# Patient Record
Sex: Female | Born: 1975 | Race: White | Hispanic: No | Marital: Married | Smoking: Never smoker
Health system: Southern US, Community
[De-identification: ages and names within clinical notes are randomized; demographics above are authoritative.]

## PROBLEM LIST (undated history)

## (undated) DIAGNOSIS — IMO0002 Reserved for concepts with insufficient information to code with codable children: Secondary | ICD-10-CM

## (undated) DIAGNOSIS — K219 Gastro-esophageal reflux disease without esophagitis: Secondary | ICD-10-CM

## (undated) DIAGNOSIS — Z8619 Personal history of other infectious and parasitic diseases: Secondary | ICD-10-CM

## (undated) DIAGNOSIS — A499 Bacterial infection, unspecified: Secondary | ICD-10-CM

## (undated) DIAGNOSIS — B379 Candidiasis, unspecified: Secondary | ICD-10-CM

## (undated) DIAGNOSIS — Z8719 Personal history of other diseases of the digestive system: Secondary | ICD-10-CM

## (undated) DIAGNOSIS — R87619 Unspecified abnormal cytological findings in specimens from cervix uteri: Secondary | ICD-10-CM

## (undated) HISTORY — DX: Candidiasis, unspecified: B37.9

## (undated) HISTORY — DX: Personal history of other diseases of the digestive system: Z87.19

## (undated) HISTORY — DX: Reserved for concepts with insufficient information to code with codable children: IMO0002

## (undated) HISTORY — DX: Bacterial infection, unspecified: A49.9

## (undated) HISTORY — DX: Unspecified abnormal cytological findings in specimens from cervix uteri: R87.619

## (undated) HISTORY — DX: Personal history of other infectious and parasitic diseases: Z86.19

---
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
## 2002-06-03 ENCOUNTER — Other Ambulatory Visit: Admission: RE | Admit: 2002-06-03 | Discharge: 2002-06-03 | Payer: Self-pay | Admitting: Obstetrics & Gynecology

## 2003-06-21 ENCOUNTER — Other Ambulatory Visit: Admission: RE | Admit: 2003-06-21 | Discharge: 2003-06-21 | Payer: Self-pay | Admitting: Obstetrics & Gynecology

## 2006-08-18 ENCOUNTER — Ambulatory Visit (HOSPITAL_COMMUNITY): Admission: RE | Admit: 2006-08-18 | Discharge: 2006-08-18 | Payer: Self-pay | Admitting: Family Medicine

## 2006-08-18 ENCOUNTER — Emergency Department (HOSPITAL_COMMUNITY): Admission: EM | Admit: 2006-08-18 | Discharge: 2006-08-18 | Payer: Self-pay | Admitting: Family Medicine

## 2007-07-17 IMAGING — CR DG ANKLE COMPLETE 3+V*L*
3 series · 3 of 3 positions shown · non-contrast
Comparison: none

CLINICAL DATA: Left ankle pain and swelling status post fall.
 LEFT ANKLE - 3 VIEW:

[view not recorded (1 of 3)]
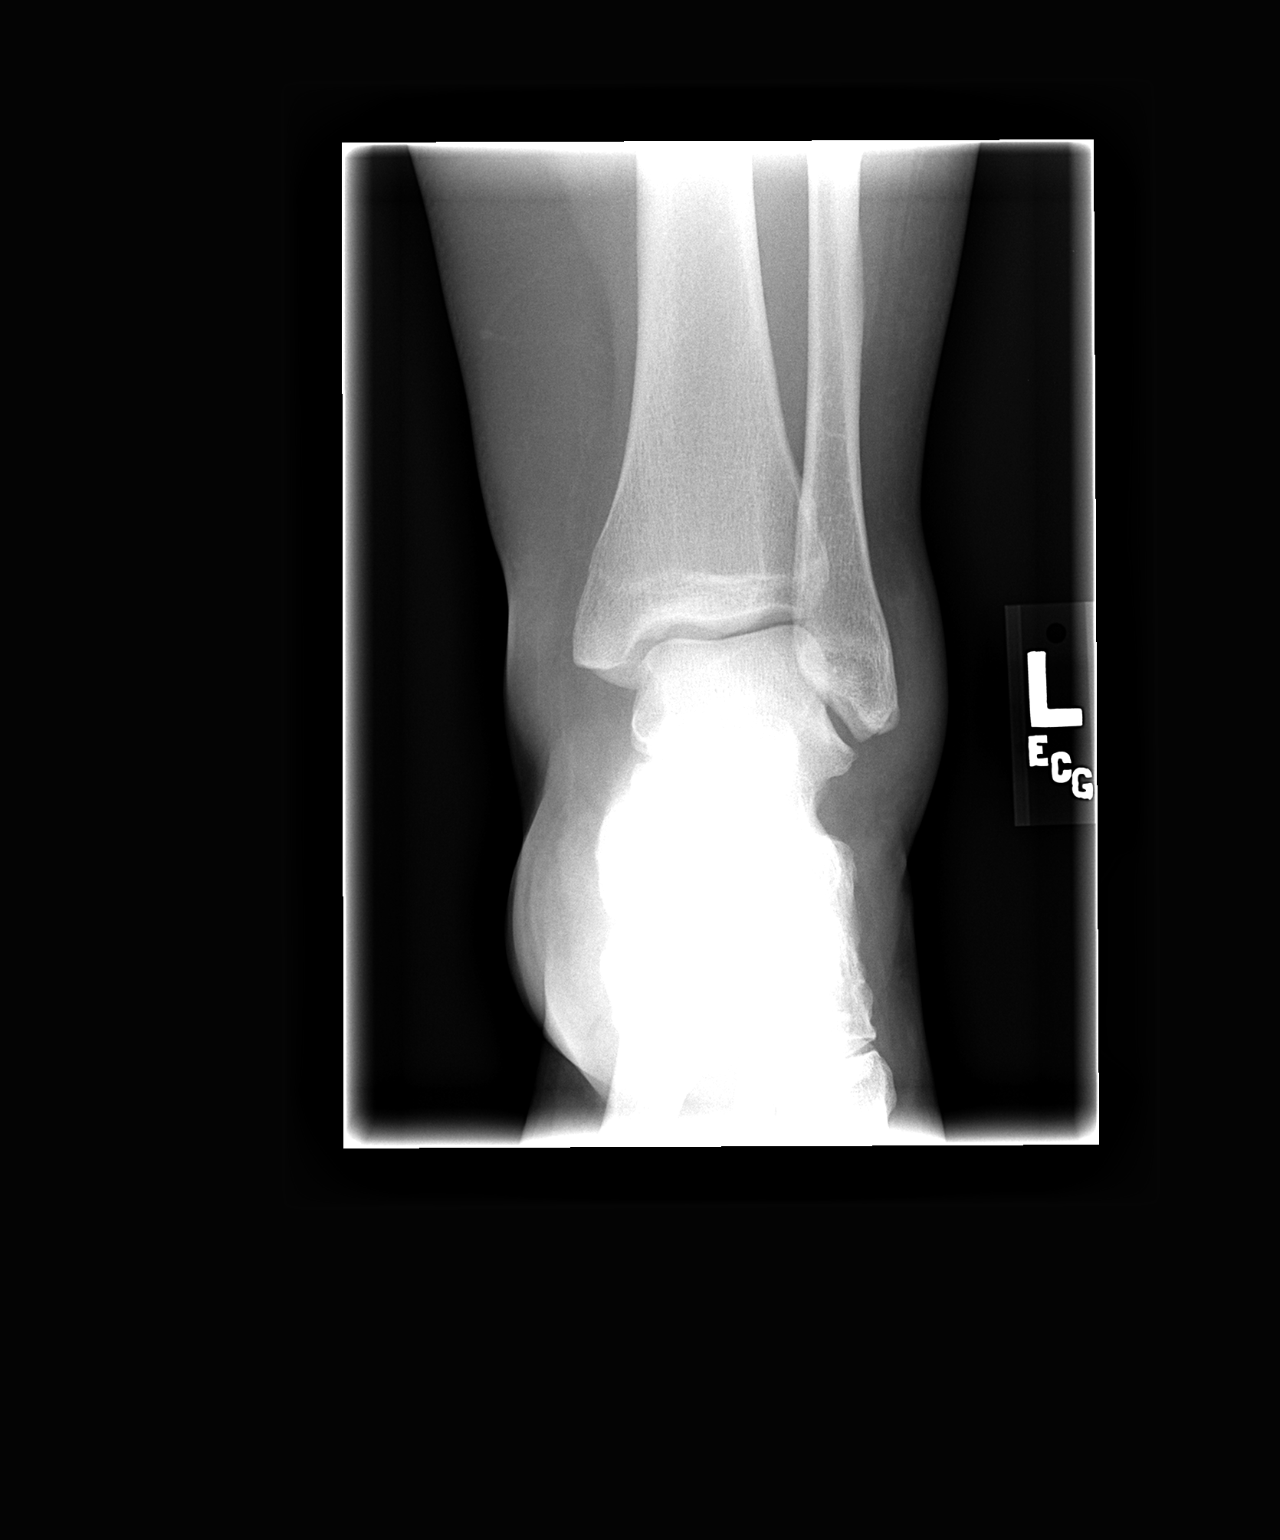

[view not recorded (2 of 3)]
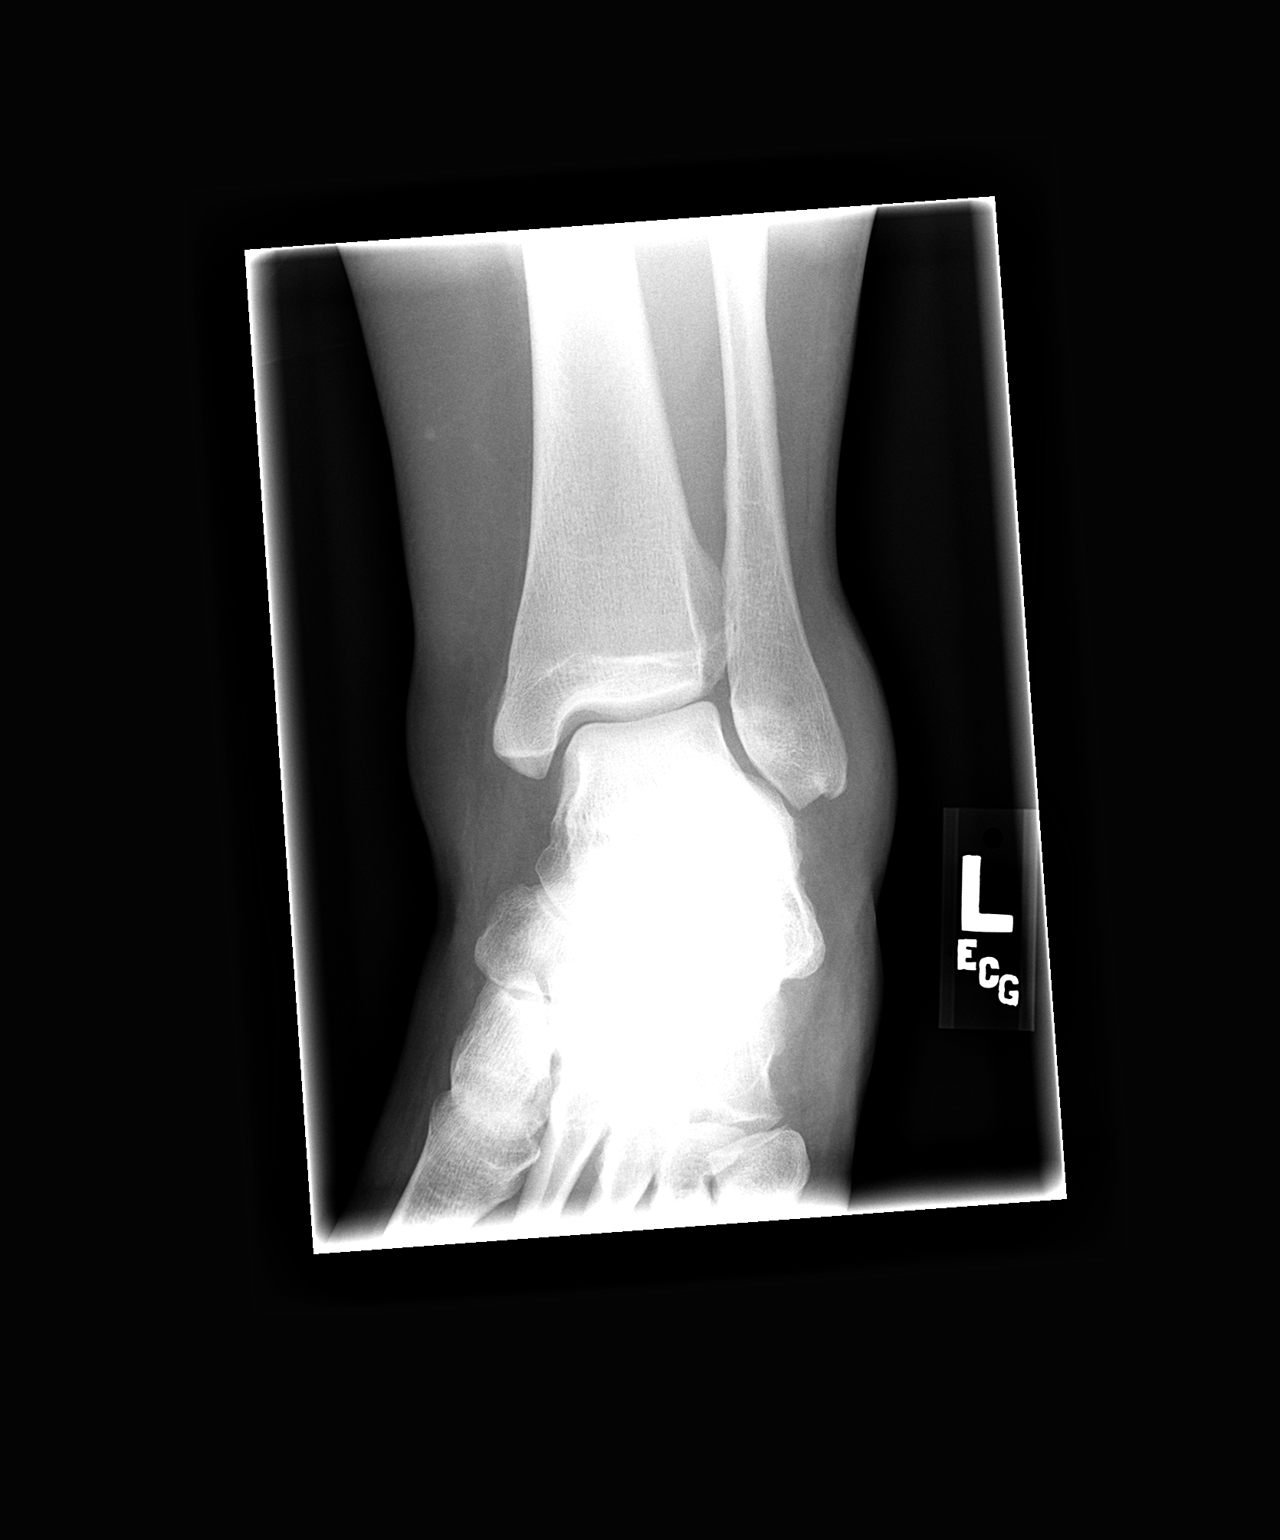

[view not recorded (3 of 3)]
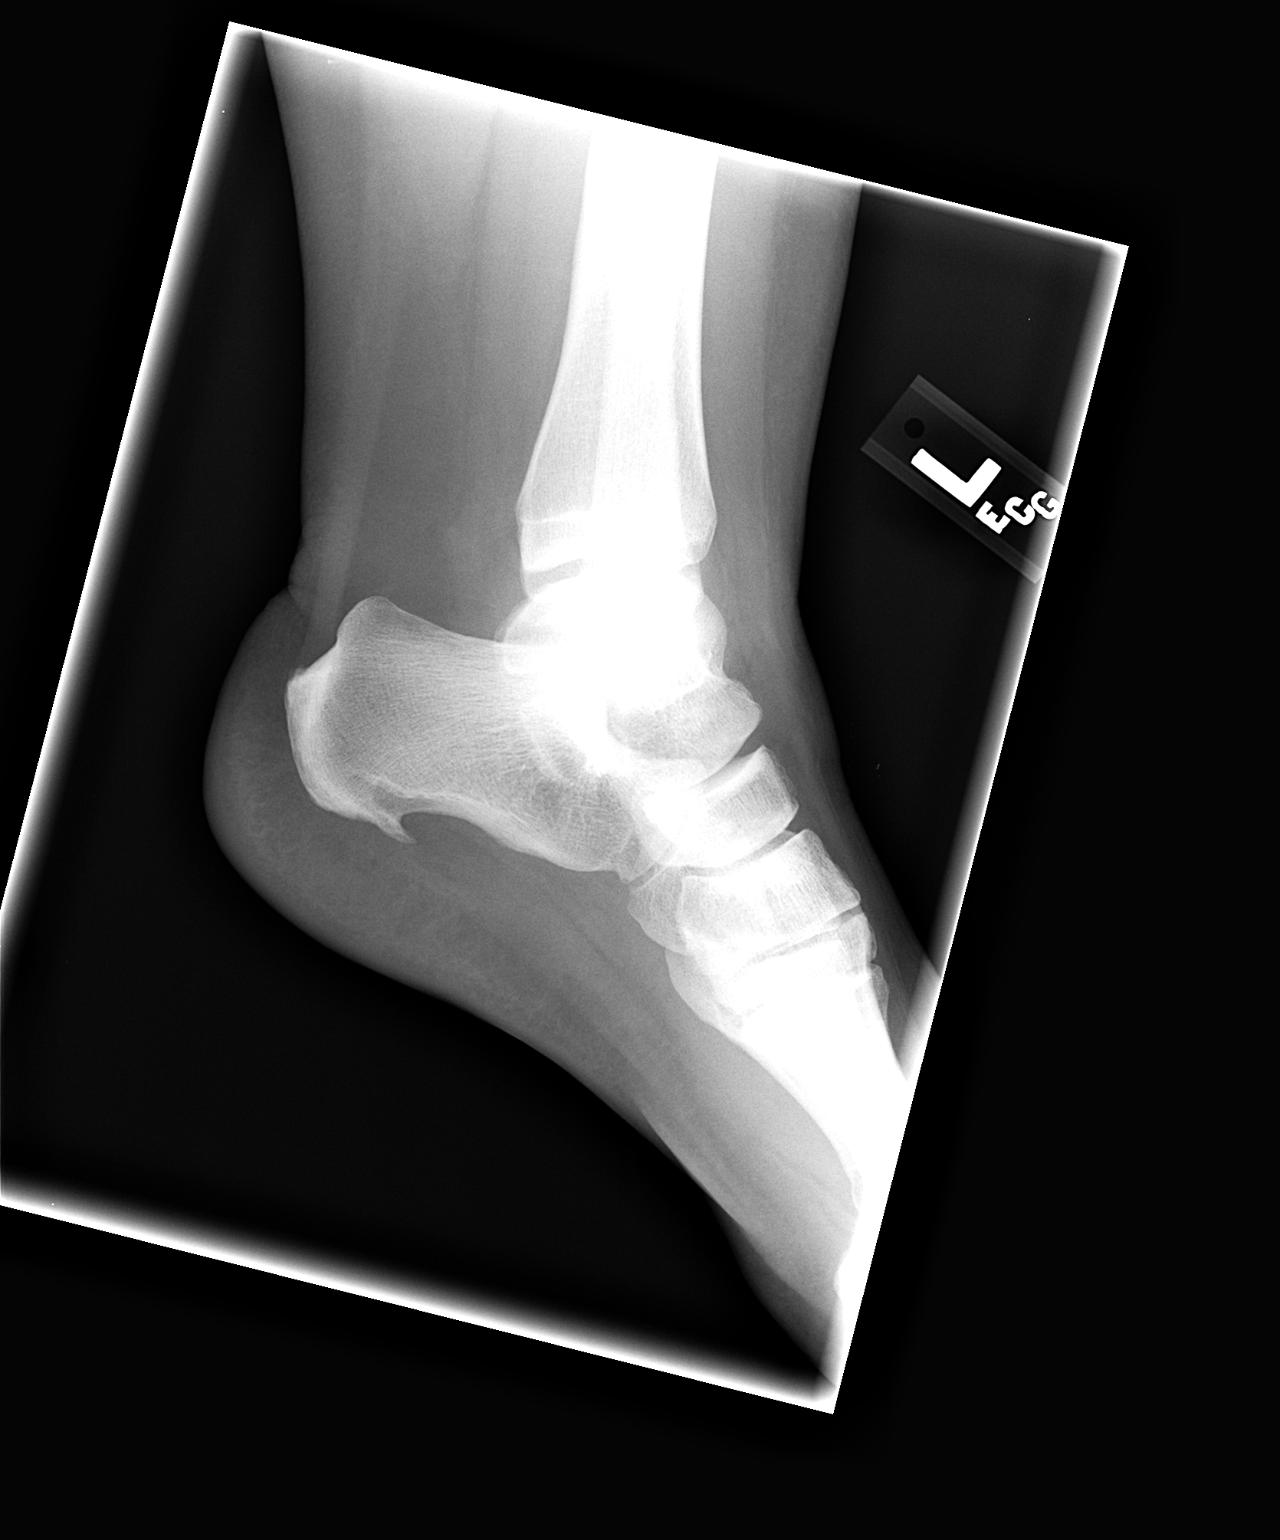

[3 of 3 positions shown; findings below may reference images not displayed]

FINDINGS: There is moderate soft tissue swelling, especially laterally.  No acute fracture or dislocation is seen. There are small plantar calcaneal spurs.
IMPRESSION: Soft tissue swelling.  No acute osseous findings

## 2007-11-11 ENCOUNTER — Emergency Department (HOSPITAL_COMMUNITY): Admission: EM | Admit: 2007-11-11 | Discharge: 2007-11-11 | Payer: Self-pay | Admitting: Emergency Medicine

## 2009-02-03 HISTORY — PX: FASCIOTOMY: SHX132

## 2011-07-26 LAB — URINALYSIS, ROUTINE W REFLEX MICROSCOPIC
Bilirubin Urine: NEGATIVE
Hgb urine dipstick: NEGATIVE
Nitrite: NEGATIVE
Specific Gravity, Urine: 1.005
pH: 6

## 2012-02-05 ENCOUNTER — Encounter (INDEPENDENT_AMBULATORY_CARE_PROVIDER_SITE_OTHER): Payer: PRIVATE HEALTH INSURANCE

## 2012-02-05 DIAGNOSIS — Z331 Pregnant state, incidental: Secondary | ICD-10-CM

## 2012-02-05 LAB — OB RESULTS CONSOLE HIV ANTIBODY (ROUTINE TESTING): HIV: NONREACTIVE

## 2012-02-05 LAB — OB RESULTS CONSOLE HEPATITIS B SURFACE ANTIGEN: Hepatitis B Surface Ag: NEGATIVE

## 2012-02-05 LAB — OB RESULTS CONSOLE ANTIBODY SCREEN: Antibody Screen: NEGATIVE

## 2012-02-22 ENCOUNTER — Encounter: Payer: Self-pay | Admitting: Obstetrics and Gynecology

## 2012-02-22 ENCOUNTER — Ambulatory Visit (INDEPENDENT_AMBULATORY_CARE_PROVIDER_SITE_OTHER): Payer: PRIVATE HEALTH INSURANCE | Admitting: Obstetrics and Gynecology

## 2012-02-22 VITALS — BP 118/72 | Ht 66.0 in | Wt 269.0 lb

## 2012-02-22 DIAGNOSIS — Z87898 Personal history of other specified conditions: Secondary | ICD-10-CM

## 2012-02-22 DIAGNOSIS — Z8742 Personal history of other diseases of the female genital tract: Secondary | ICD-10-CM

## 2012-02-22 DIAGNOSIS — Z331 Pregnant state, incidental: Secondary | ICD-10-CM

## 2012-02-22 LAB — POCT URINALYSIS DIPSTICK
Glucose, UA: NEGATIVE
Ketones, UA: NEGATIVE
Leukocytes, UA: NEGATIVE
Protein, UA: NEGATIVE
Spec Grav, UA: 1.02
Urobilinogen, UA: NEGATIVE

## 2012-02-22 NOTE — Progress Notes (Signed)
Pt states she has had a Flu shot Pt declines Genetic testing Pt last pap was 12/12 "Abnormal" Colpo was done in 11/2011 and was "WNL" Pt states she has no concerns at this time.

## 2012-02-26 LAB — PAP IG AND CT-NG NAA
Chlamydia Probe Amp: NEGATIVE
GC Probe Amp: NEGATIVE

## 2012-02-29 ENCOUNTER — Other Ambulatory Visit: Payer: Self-pay | Admitting: Obstetrics and Gynecology

## 2012-02-29 ENCOUNTER — Ambulatory Visit (INDEPENDENT_AMBULATORY_CARE_PROVIDER_SITE_OTHER): Payer: PRIVATE HEALTH INSURANCE

## 2012-02-29 DIAGNOSIS — Z36 Encounter for antenatal screening of mother: Secondary | ICD-10-CM

## 2012-02-29 LAB — US OB COMP LESS 14 WKS

## 2012-03-05 ENCOUNTER — Other Ambulatory Visit: Payer: Self-pay | Admitting: Obstetrics and Gynecology

## 2012-03-05 DIAGNOSIS — Z36 Encounter for antenatal screening of mother: Secondary | ICD-10-CM

## 2012-03-07 ENCOUNTER — Ambulatory Visit (INDEPENDENT_AMBULATORY_CARE_PROVIDER_SITE_OTHER): Payer: PRIVATE HEALTH INSURANCE

## 2012-03-07 ENCOUNTER — Other Ambulatory Visit: Payer: Self-pay | Admitting: Obstetrics and Gynecology

## 2012-03-07 DIAGNOSIS — Z36 Encounter for antenatal screening of mother: Secondary | ICD-10-CM

## 2012-03-07 LAB — US OB COMP LESS 14 WKS

## 2012-03-15 DIAGNOSIS — Z87898 Personal history of other specified conditions: Secondary | ICD-10-CM | POA: Insufficient documentation

## 2012-03-15 NOTE — Progress Notes (Signed)
  Subjective:    Alyssa Spence is being seen today for her first obstetrical visit.  This is a planned pregnancy. She is at [redacted]w[redacted]d gestation. Her obstetrical history is significant for n/a. Relationship with FOB: spouse, living together. Patient does intend to breast feed. Pregnancy history fully reviewed.  Patient reports no complaints. Pt had initial [facility] with another provider, records reviewed. Pt is a CMA at [employer].   Review of Systems:   Review of Systems  Gastrointestinal: Positive for nausea.       Mild, improving  All other systems reviewed and are negative.    Objective:     BP 118/72  Ht 5\' 6"  (1.676 m)  Wt 269 lb (122.018 kg)  BMI 43.42 kg/m2  LMP 12/11/2011 Physical Exam  Nursing note and vitals reviewed. Constitutional: She is oriented to person, place, and time. She appears well-developed and well-nourished.  HENT:  Head: Normocephalic.  Neck: Normal range of motion. No thyromegaly present.  Cardiovascular: Normal rate, regular rhythm and normal heart sounds.   Respiratory: Effort normal and breath sounds normal.  GI: Soft.       +FHT's  Genitourinary: Vagina normal and uterus normal.       aga  Musculoskeletal: Normal range of motion.  Neurological: She is alert and oriented to person, place, and time.  Skin: Skin is warm and dry.  Psychiatric: She has a normal mood and affect. Her behavior is normal.    Maternal Exam:  Abdomen: Patient reports no abdominal tenderness. Fundal height is U+2.    Introitus: Normal vulva. Normal vagina.  Pelvis: adequate for delivery.   Cervix: Cervix evaluated by sterile speculum exam and digital exam.     Fetal Exam Fetal Monitor Review: Mode: hand-held doppler probe.   Baseline rate: 150.         Assessment:    Pregnancy: G1P0 Patient Active Problem List  Diagnoses  . 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 Problem list  reviewed and updated. Pt desires 1st trimester screen and AFP.  Follow up in 2 weeks for [place] and 4wks for ROB.  Pap/GC/CT today   Malissa Hippo 03/15/2012

## 2012-03-21 ENCOUNTER — Encounter: Payer: PRIVATE HEALTH INSURANCE | Admitting: Obstetrics and Gynecology

## 2012-03-21 ENCOUNTER — Ambulatory Visit (INDEPENDENT_AMBULATORY_CARE_PROVIDER_SITE_OTHER): Payer: PRIVATE HEALTH INSURANCE | Admitting: Obstetrics and Gynecology

## 2012-03-21 VITALS — BP 130/62 | Wt 276.0 lb

## 2012-03-21 DIAGNOSIS — Z331 Pregnant state, incidental: Secondary | ICD-10-CM

## 2012-03-21 DIAGNOSIS — Z3490 Encounter for supervision of normal pregnancy, unspecified, unspecified trimester: Secondary | ICD-10-CM

## 2012-03-21 NOTE — Progress Notes (Signed)
No complaints Reviewed normal first semester screen Reviewed Pap smear showing LGSIL-schedule colposcopy Schedule AFP between 16 and 18 weeks Schedule anatomy scan in 4 weeks with colposcopy

## 2012-03-24 ENCOUNTER — Telehealth: Payer: Self-pay

## 2012-03-24 NOTE — Progress Notes (Signed)
TO BE SCHED BY Alvino Chapel

## 2012-03-24 NOTE — Telephone Encounter (Signed)
Called pt to get colpo scheduled. Dr. Su Hilt had wanted u/s for anatomy, ROB follow up  And colpo on same day. Due to limited MD's in the office during those two weeks, all of these things cannot be done on the same day. I'm looking at Dr. Debria Garret schedule for the 25th June. For Colpo. Melody Comas A

## 2012-03-24 NOTE — Progress Notes (Signed)
Addended by: Marla Roe A on: 03/24/2012 05:08 PM   Modules accepted: Orders

## 2012-03-25 ENCOUNTER — Telehealth: Payer: Self-pay

## 2012-03-25 NOTE — Telephone Encounter (Signed)
Called pt to get her sched for Colpo with Dr. Stefano Gaul on 04/29/2012.[name]

## 2012-03-28 ENCOUNTER — Encounter: Payer: PRIVATE HEALTH INSURANCE | Admitting: Obstetrics and Gynecology

## 2012-04-04 ENCOUNTER — Other Ambulatory Visit: Payer: PRIVATE HEALTH INSURANCE

## 2012-04-04 DIAGNOSIS — Z3490 Encounter for supervision of normal pregnancy, unspecified, unspecified trimester: Secondary | ICD-10-CM

## 2012-04-08 LAB — ALPHA FETOPROTEIN, MATERNAL
AFP: 17.2 IU/mL
Curr Gest Age: 16.5 wks.days
MoM for AFP: 0.85

## 2012-04-18 ENCOUNTER — Ambulatory Visit (INDEPENDENT_AMBULATORY_CARE_PROVIDER_SITE_OTHER): Payer: PRIVATE HEALTH INSURANCE

## 2012-04-18 ENCOUNTER — Encounter: Payer: Self-pay | Admitting: Obstetrics and Gynecology

## 2012-04-18 ENCOUNTER — Ambulatory Visit (INDEPENDENT_AMBULATORY_CARE_PROVIDER_SITE_OTHER): Payer: PRIVATE HEALTH INSURANCE | Admitting: Obstetrics and Gynecology

## 2012-04-18 VITALS — BP 120/78 | Wt 276.0 lb

## 2012-04-18 DIAGNOSIS — Z3689 Encounter for other specified antenatal screening: Secondary | ICD-10-CM

## 2012-04-18 DIAGNOSIS — Z3480 Encounter for supervision of other normal pregnancy, unspecified trimester: Secondary | ICD-10-CM

## 2012-04-18 DIAGNOSIS — Z3490 Encounter for supervision of normal pregnancy, unspecified, unspecified trimester: Secondary | ICD-10-CM

## 2012-04-18 NOTE — Progress Notes (Signed)
EFW:  272grams   10oz#,   57% VQQ:VZDGLO Positionbreech Placenta location: posterior,  AFP WNL May use robitussin for cough Rt 4 weeks

## 2012-04-18 NOTE — Progress Notes (Signed)
C/O of dry cough & congestion at times

## 2012-04-23 LAB — US OB COMP + 14 WK

## 2012-04-29 ENCOUNTER — Encounter (INDEPENDENT_AMBULATORY_CARE_PROVIDER_SITE_OTHER): Payer: PRIVATE HEALTH INSURANCE

## 2012-04-29 ENCOUNTER — Encounter: Payer: Self-pay | Admitting: Obstetrics and Gynecology

## 2012-04-29 ENCOUNTER — Ambulatory Visit (INDEPENDENT_AMBULATORY_CARE_PROVIDER_SITE_OTHER): Payer: PRIVATE HEALTH INSURANCE | Admitting: Obstetrics and Gynecology

## 2012-04-29 VITALS — BP 120/70 | Wt 277.0 lb

## 2012-04-29 DIAGNOSIS — N870 Mild cervical dysplasia: Secondary | ICD-10-CM

## 2012-04-29 DIAGNOSIS — Z331 Pregnant state, incidental: Secondary | ICD-10-CM

## 2012-04-29 DIAGNOSIS — E669 Obesity, unspecified: Secondary | ICD-10-CM

## 2012-04-29 NOTE — Progress Notes (Signed)
See colposcopy note. Doing well. Return to office in 4 weeks.  Procedure:  The patient is a 36 year old female at [redacted] weeks gestation.  She has had 2 colposcopies in the past because of abnormal Pap smears.  She was told that her HPV screen was negative.  Her most recent Pap smear showed a low-grade squamous lesion.  Speculum exam performed.  The vagina and cervix were coated with acetic acid.  Colposcopy was performed using the green filter and a regular light.  White epithelium was noted at the 8 o'clock position.  No other pathology was appreciated.  The endocervical canal was visualized and no lesions were noted.  The patient tolerated her procedure well.  Because the patient is pregnant I do not feel that biopsies are needed at this time.  15 min. was spent with the patient discussing the implications of abnormal Pap smears.  Human papilloma virus infections were discussed. She is comfortable repeating her studies after she delivers.  Dr. Stefano Gaul Dr. Stefano Gaul

## 2012-04-29 NOTE — Progress Notes (Signed)
Previous Pap Smear: 02-22-12  "LSIL HPV/Mild Dysplasia CINI" Previous Colposcopy: N/A Referred From: [facility]; Almond Lint CNM LMP: 12/11/2011 Contraception: NONE PT IS PREGNANT.  LC CMA G,P: 1;0

## 2012-04-29 NOTE — Progress Notes (Signed)
Pt without complaints  Colposcopy today

## 2012-05-12 ENCOUNTER — Encounter: Payer: PRIVATE HEALTH INSURANCE | Admitting: Obstetrics and Gynecology

## 2012-05-23 ENCOUNTER — Telehealth: Payer: Self-pay | Admitting: Obstetrics and Gynecology

## 2012-05-23 NOTE — Telephone Encounter (Signed)
Spoke w/pt. °

## 2012-05-23 NOTE — Telephone Encounter (Signed)
Triage/epic 

## 2012-05-26 NOTE — Telephone Encounter (Signed)
05-23-12: Lm on vm to cb per telephone call. Lm on vm for pt to contact office after hours if needed.

## 2012-05-26 NOTE — Telephone Encounter (Signed)
Lm on vm to cb to follow up per telephone call form 05-23-12.

## 2012-05-27 NOTE — Telephone Encounter (Signed)
No response from pt. Chart to fb.  

## 2012-05-28 ENCOUNTER — Encounter: Payer: Self-pay | Admitting: Obstetrics and Gynecology

## 2012-05-28 ENCOUNTER — Ambulatory Visit (INDEPENDENT_AMBULATORY_CARE_PROVIDER_SITE_OTHER): Payer: PRIVATE HEALTH INSURANCE | Admitting: Obstetrics and Gynecology

## 2012-05-28 VITALS — BP 122/64 | Wt 286.0 lb

## 2012-05-28 DIAGNOSIS — N949 Unspecified condition associated with female genital organs and menstrual cycle: Secondary | ICD-10-CM

## 2012-05-28 DIAGNOSIS — Z331 Pregnant state, incidental: Secondary | ICD-10-CM

## 2012-05-28 DIAGNOSIS — N898 Other specified noninflammatory disorders of vagina: Secondary | ICD-10-CM

## 2012-05-28 LAB — POCT WET PREP (WET MOUNT)
Bacteria Wet Prep HPF POC: NEGATIVE
Clue Cells Wet Prep Whiff POC: NEGATIVE
KOH Wet Prep POC: NEGATIVE

## 2012-05-28 MED ORDER — METRONIDAZOLE 0.75 % VA GEL: 1.0000 | Freq: Two times a day (BID) | VAGINAL | Status: AC

## 2012-05-28 NOTE — Progress Notes (Signed)
Pt c/o yellowish d/c. States that she doesn't have itching or burning. Wearing panty liner and by the end of the day she has noticed a foul smell.

## 2012-05-28 NOTE — Progress Notes (Signed)
The patient complains of vaginal older.  Wet prep negative.  [employer]ro[employer]el each night for 5 nights.  Patient declines STD testing. Weight gain and edema discussed. Return to office in 4 weeks. Glucola next visit. Dr. Stefano Gaul

## 2012-06-26 ENCOUNTER — Encounter: Payer: Self-pay | Admitting: Obstetrics and Gynecology

## 2012-06-26 ENCOUNTER — Other Ambulatory Visit: Payer: PRIVATE HEALTH INSURANCE

## 2012-06-26 ENCOUNTER — Ambulatory Visit (INDEPENDENT_AMBULATORY_CARE_PROVIDER_SITE_OTHER): Payer: PRIVATE HEALTH INSURANCE | Admitting: Obstetrics and Gynecology

## 2012-06-26 VITALS — BP 118/74 | Wt 288.0 lb

## 2012-06-26 DIAGNOSIS — Z331 Pregnant state, incidental: Secondary | ICD-10-CM

## 2012-06-26 NOTE — Progress Notes (Signed)
[redacted]w[redacted]d Glucola, hemoglobin, RPR today. Preterm labor, childbirth classes, breast-feeding, circumcision, contraception discussed. Return office in 2 weeks. O+. Dr. Stefano Gaul

## 2012-06-26 NOTE — Progress Notes (Signed)
1 GTT today. Draw @ 4:17pm. C/O:of a burning sensation above her belly button that comes and goes.

## 2012-06-27 LAB — GLUCOSE TOLERANCE, 1 HOUR (50G) W/O FASTING: Glucose, 1 Hour GTT: 134 mg/dL (ref 70–140)

## 2012-07-04 ENCOUNTER — Other Ambulatory Visit: Payer: Self-pay

## 2012-07-04 ENCOUNTER — Telehealth: Payer: Self-pay

## 2012-07-04 DIAGNOSIS — O99810 Abnormal glucose complicating pregnancy: Secondary | ICD-10-CM

## 2012-07-04 NOTE — Telephone Encounter (Signed)
Pt was called to schedule her 3 GTT. Pt 3 GTT is scheduled for 07-11-12. Pt will come by Tuesday morning to pick up 3 hr paper work  Rather than have it mailed.  [facility] CMA

## 2012-07-10 ENCOUNTER — Ambulatory Visit (INDEPENDENT_AMBULATORY_CARE_PROVIDER_SITE_OTHER): Payer: PRIVATE HEALTH INSURANCE | Admitting: Obstetrics and Gynecology

## 2012-07-10 ENCOUNTER — Encounter: Payer: Self-pay | Admitting: Obstetrics and Gynecology

## 2012-07-10 VITALS — BP 108/64 | Wt 291.0 lb

## 2012-07-10 DIAGNOSIS — Z3490 Encounter for supervision of normal pregnancy, unspecified, unspecified trimester: Secondary | ICD-10-CM

## 2012-07-10 DIAGNOSIS — Z331 Pregnant state, incidental: Secondary | ICD-10-CM

## 2012-07-10 NOTE — Progress Notes (Signed)
Office Visit on 06/26/2012  Component Date Value Range Status  . Glucose, 1 Hour GTT 06/26/2012 134  70 - 140 mg/dL Final  . RPR 16/08/9603 NON REAC  NON REAC Final  . Hemoglobin 06/26/2012 12.2  12.0 - 15.0 g/dL Final  No complaints except frequency in middle of night but pt thinks its her habits FKCs Questions answered RTO 2wks U/s for S>D if persists

## 2012-07-12 LAB — GLUCOSE TOLERANCE, 3 HOURS
Glucose Tolerance, 1 hour: 203 mg/dL — ABNORMAL HIGH (ref 70–189)
Glucose Tolerance, 2 hour: 119 mg/dL (ref 70–164)
Glucose Tolerance, Fasting: 92 mg/dL (ref 70–104)
Glucose, GTT - 3 Hour: 63 mg/dL — ABNORMAL LOW (ref 70–144)

## 2012-07-16 ENCOUNTER — Telehealth: Payer: Self-pay

## 2012-07-16 NOTE — Telephone Encounter (Signed)
Pt was called and made aware she passed her 3 GTT. Renaldo Reel CNM reviewed pt results before pt was called. Pt scored a single abnormal value. Pt was also made aware it was ok to have the flu vaccine.  LC

## 2012-07-24 ENCOUNTER — Ambulatory Visit (INDEPENDENT_AMBULATORY_CARE_PROVIDER_SITE_OTHER): Payer: PRIVATE HEALTH INSURANCE | Admitting: Obstetrics and Gynecology

## 2012-07-24 VITALS — BP 108/76 | Wt 296.0 lb

## 2012-07-24 DIAGNOSIS — Z331 Pregnant state, incidental: Secondary | ICD-10-CM

## 2012-07-24 NOTE — Progress Notes (Signed)
[redacted]w[redacted]d  GFM Muscle aches

## 2012-07-24 NOTE — Progress Notes (Signed)
Pt stated that she had fell on the grass yesterday. Pt also stated she has been feeling baby move all day. Pt stated no other issues .

## 2012-08-07 ENCOUNTER — Encounter: Payer: Self-pay | Admitting: Obstetrics and Gynecology

## 2012-08-07 ENCOUNTER — Ambulatory Visit (INDEPENDENT_AMBULATORY_CARE_PROVIDER_SITE_OTHER): Payer: PRIVATE HEALTH INSURANCE | Admitting: Obstetrics and Gynecology

## 2012-08-07 VITALS — BP 114/70 | Wt 301.0 lb

## 2012-08-07 DIAGNOSIS — Z331 Pregnant state, incidental: Secondary | ICD-10-CM

## 2012-08-07 NOTE — Patient Instructions (Signed)
Fetal Movement Counts Patient Name: __________________________________________________ Patient Due Date: ____________________ Kick counts is highly recommended in high risk pregnancies, but it is a good idea for every pregnant woman to do. Start counting fetal movements at 28 weeks of the pregnancy. Fetal movements increase after eating a full meal or eating or drinking something sweet (the blood sugar is higher). It is also important to drink plenty of fluids (well hydrated) before doing the count. Lie on your left side because it helps with the circulation or you can sit in a comfortable chair with your arms over your belly (abdomen) with no distractions around you. DOING THE COUNT  Try to do the count the same time of day each time you do it.  Mark the day and time, then see how long it takes for you to feel 10 movements (kicks, flutters, swishes, rolls). You should have at least 10 movements within 2 hours. You will most likely feel 10 movements in much less than 2 hours. If you do not, wait an hour and count again. After a couple of days you will see a pattern.  What you are looking for is a change in the pattern or not enough counts in 2 hours. Is it taking longer in time to reach 10 movements? SEEK MEDICAL CARE IF:  You feel less than 10 counts in 2 hours. Tried twice.  No movement in one hour.  The pattern is changing or taking longer each day to reach 10 counts in 2 hours.  You feel the baby is not moving as it usually does. Date: ____________ Movements: ____________ Start time: ____________ Finish time: ____________  Date: ____________ Movements: ____________ Start time: ____________ Finish time: ____________ Date: ____________ Movements: ____________ Start time: ____________ Finish time: ____________ Date: ____________ Movements: ____________ Start time: ____________ Finish time: ____________ Date: ____________ Movements: ____________ Start time: ____________ Finish time:  ____________ Date: ____________ Movements: ____________ Start time: ____________ Finish time: ____________ Date: ____________ Movements: ____________ Start time: ____________ Finish time: ____________ Date: ____________ Movements: ____________ Start time: ____________ Finish time: ____________  Date: ____________ Movements: ____________ Start time: ____________ Finish time: ____________ Date: ____________ Movements: ____________ Start time: ____________ Finish time: ____________ Date: ____________ Movements: ____________ Start time: ____________ Finish time: ____________ Date: ____________ Movements: ____________ Start time: ____________ Finish time: ____________ Date: ____________ Movements: ____________ Start time: ____________ Finish time: ____________ Date: ____________ Movements: ____________ Start time: ____________ Finish time: ____________ Date: ____________ Movements: ____________ Start time: ____________ Finish time: ____________  Date: ____________ Movements: ____________ Start time: ____________ Finish time: ____________ Date: ____________ Movements: ____________ Start time: ____________ Finish time: ____________ Date: ____________ Movements: ____________ Start time: ____________ Finish time: ____________ Date: ____________ Movements: ____________ Start time: ____________ Finish time: ____________ Date: ____________ Movements: ____________ Start time: ____________ Finish time: ____________ Date: ____________ Movements: ____________ Start time: ____________ Finish time: ____________ Date: ____________ Movements: ____________ Start time: ____________ Finish time: ____________  Date: ____________ Movements: ____________ Start time: ____________ Finish time: ____________ Date: ____________ Movements: ____________ Start time: ____________ Finish time: ____________ Date: ____________ Movements: ____________ Start time: ____________ Finish time: ____________ Date: ____________ Movements:  ____________ Start time: ____________ Finish time: ____________ Date: ____________ Movements: ____________ Start time: ____________ Finish time: ____________ Date: ____________ Movements: ____________ Start time: ____________ Finish time: ____________ Date: ____________ Movements: ____________ Start time: ____________ Finish time: ____________  Date: ____________ Movements: ____________ Start time: ____________ Finish time: ____________ Date: ____________ Movements: ____________ Start time: ____________ Finish time: ____________ Date: ____________ Movements: ____________ Start time: ____________ Finish time: ____________ Date: ____________ Movements:   ____________ Start time: ____________ Finish time: ____________ Date: ____________ Movements: ____________ Start time: ____________ Finish time: ____________ Date: ____________ Movements: ____________ Start time: ____________ Finish time: ____________ Date: ____________ Movements: ____________ Start time: ____________ Finish time: ____________  Date: ____________ Movements: ____________ Start time: ____________ Finish time: ____________ Date: ____________ Movements: ____________ Start time: ____________ Finish time: ____________ Date: ____________ Movements: ____________ Start time: ____________ Finish time: ____________ Date: ____________ Movements: ____________ Start time: ____________ Finish time: ____________ Date: ____________ Movements: ____________ Start time: ____________ Finish time: ____________ Date: ____________ Movements: ____________ Start time: ____________ Finish time: ____________ Date: ____________ Movements: ____________ Start time: ____________ Finish time: ____________  Date: ____________ Movements: ____________ Start time: ____________ Finish time: ____________ Date: ____________ Movements: ____________ Start time: ____________ Finish time: ____________ Date: ____________ Movements: ____________ Start time: ____________ Finish  time: ____________ Date: ____________ Movements: ____________ Start time: ____________ Finish time: ____________ Date: ____________ Movements: ____________ Start time: ____________ Finish time: ____________ Date: ____________ Movements: ____________ Start time: ____________ Finish time: ____________ Date: ____________ Movements: ____________ Start time: ____________ Finish time: ____________  Date: ____________ Movements: ____________ Start time: ____________ Finish time: ____________ Date: ____________ Movements: ____________ Start time: ____________ Finish time: ____________ Date: ____________ Movements: ____________ Start time: ____________ Finish time: ____________ Date: ____________ Movements: ____________ Start time: ____________ Finish time: ____________ Date: ____________ Movements: ____________ Start time: ____________ Finish time: ____________ Date: ____________ Movements: ____________ Start time: ____________ Finish time: ____________ Document Released: 11/21/2006 Document Revised: 01/14/2012 Document Reviewed: 05/24/2009 [employer] Patient Information 2013 [employer].  

## 2012-08-07 NOTE — Progress Notes (Signed)
A/P GBS @[place] Fetal kick counts reviewed Labor reviewed with pt All patients  questions answered 

## 2012-08-21 ENCOUNTER — Ambulatory Visit (INDEPENDENT_AMBULATORY_CARE_PROVIDER_SITE_OTHER): Payer: PRIVATE HEALTH INSURANCE | Admitting: Obstetrics and Gynecology

## 2012-08-21 ENCOUNTER — Encounter: Payer: Self-pay | Admitting: Obstetrics and Gynecology

## 2012-08-21 VITALS — BP 104/66 | Wt 305.0 lb

## 2012-08-21 DIAGNOSIS — O26849 Uterine size-date discrepancy, unspecified trimester: Secondary | ICD-10-CM

## 2012-08-21 DIAGNOSIS — Z331 Pregnant state, incidental: Secondary | ICD-10-CM

## 2012-08-21 NOTE — Progress Notes (Signed)
[redacted]w[redacted]d GBS today Pt concerned re fetal weight.  Wt gain=47 lbs.  U/S [place]

## 2012-08-21 NOTE — Addendum Note (Signed)
Addended by: Lerry Liner D on: 08/21/2012 05:39 PM   Modules accepted: Orders

## 2012-08-24 LAB — STREP B DNA PROBE: GBSP: POSITIVE

## 2012-08-28 ENCOUNTER — Ambulatory Visit (INDEPENDENT_AMBULATORY_CARE_PROVIDER_SITE_OTHER): Payer: PRIVATE HEALTH INSURANCE | Admitting: Obstetrics and Gynecology

## 2012-08-28 ENCOUNTER — Ambulatory Visit (INDEPENDENT_AMBULATORY_CARE_PROVIDER_SITE_OTHER): Payer: PRIVATE HEALTH INSURANCE

## 2012-08-28 VITALS — BP 118/78 | Wt 308.0 lb

## 2012-08-28 DIAGNOSIS — O26849 Uterine size-date discrepancy, unspecified trimester: Secondary | ICD-10-CM

## 2012-08-28 DIAGNOSIS — E669 Obesity, unspecified: Secondary | ICD-10-CM

## 2012-08-28 DIAGNOSIS — Z331 Pregnant state, incidental: Secondary | ICD-10-CM

## 2012-08-28 NOTE — Progress Notes (Signed)
37w 2 days. Signs and symptoms of labor reviewed

## 2012-08-28 NOTE — Progress Notes (Signed)
37w 2d  Ultrasound shows:  EFW 3445g %ILE80.8        [place] EDD: 09/16/12             AFI: 15.14            Cervical length: not performed cm            Placenta localization: posterior            Fetal presentation: cephalic    Anatomy survey completed yes    Anatomy survey is normal            Gender : female    Comments:

## 2012-09-01 LAB — US OB FOLLOW UP

## 2012-09-03 ENCOUNTER — Ambulatory Visit (INDEPENDENT_AMBULATORY_CARE_PROVIDER_SITE_OTHER): Payer: PRIVATE HEALTH INSURANCE | Admitting: Obstetrics and Gynecology

## 2012-09-03 ENCOUNTER — Encounter: Payer: Self-pay | Admitting: Obstetrics and Gynecology

## 2012-09-03 VITALS — BP 120/80 | Wt 310.0 lb

## 2012-09-03 DIAGNOSIS — Z331 Pregnant state, incidental: Secondary | ICD-10-CM

## 2012-09-03 NOTE — Patient Instructions (Signed)
Back Pain in Pregnancy Back pain during pregnancy is common. It happens in about half of all pregnancies. It is important for you and your baby that you remain active during your pregnancy.If you feel that back pain is not allowing you to remain active or sleep well, it is time to see your caregiver. Back pain may be caused by several factors related to changes during your pregnancy.Fortunately, unless you had trouble with your back before your pregnancy, the pain is likely to get better after you deliver. Low back pain usually occurs between the fifth and seventh months of pregnancy. It can, however, happen in the first couple months. Factors that increase the risk of back problems include:   Previous back problems.  Injury to your back.  Having twins or multiple births.  A chronic cough.  Stress.  Job-related repetitive motions.  Muscle or spinal disease in the back.  Family history of back problems, ruptured (herniated) discs, or osteoporosis.  Depression, anxiety, and panic attacks. CAUSES   When you are pregnant, your body produces a hormone called relaxin. This hormonemakes the ligaments connecting the low back and pubic bones more flexible. This flexibility allows the baby to be delivered more easily. When your ligaments are loose, your muscles need to work harder to support your back. Soreness in your back can come from tired muscles. Soreness can also come from back tissues that are irritated since they are receiving less support.  As the baby grows, it puts pressure on the nerves and blood vessels in your pelvis. This can cause back pain.  As the baby grows and gets heavier during pregnancy, the uterus pushes the stomach muscles forward and changes your center of gravity. This makes your back muscles work harder to maintain good posture. SYMPTOMS  Lumbar pain during pregnancy Lumbar pain during pregnancy usually occurs at or above the waist in the center of the back. There  may be pain and numbness that radiates into your leg or foot. This is similar to low back pain experienced by non-pregnant women. It usually increases with sitting for long periods of time, standing, or repetitive lifting. Tenderness may also be present in the muscles along your upper back. Posterior pelvic pain during pregnancy Pain in the back of the pelvis is more common than lumbar pain in pregnancy. It is a deep pain felt in your side at the waistline, or across the tailbone (sacrum), or in both places. You may have pain on one or both sides. This pain can also go into the buttocks and backs of the upper thighs. Pubic and groin pain may also be present. The pain does not quickly resolve with rest, and morning stiffness may also be present. Pelvic pain during pregnancy can be brought on by most activities. A high level of fitness before and during pregnancy may or may not prevent this problem. Labor pain is usually 1 to 2 minutes apart, lasts for about 1 minute, and involves a bearing down feeling or pressure in your pelvis. However, if you are at term with the pregnancy, constant low back pain can be the beginning of early labor, and you should be aware of this. DIAGNOSIS  X-rays of the back should not be done during the first 12 to 14 weeks of the pregnancy and only when absolutely necessary during the rest of the pregnancy. MRIs do not give off radiation and are safe during pregnancy. MRIs also should only be done when absolutely necessary. HOME CARE INSTRUCTIONS  Exercise   as directed by your caregiver. Exercise is the most effective way to prevent or manage back pain. If you have a back problem, it is especially important to avoid sports that require sudden body movements. Swimming and walking are great activities.  Do not stand in one place for long periods of time.  Do not wear high heels.  Sit in chairs with good posture. Use a pillow on your lower back if necessary. Make sure your head  rests over your shoulders and is not hanging forward.  Try sleeping on your side, preferably the left side, with a pillow or two between your legs. If you are sore after a night's rest, your bedmay betoo soft.Try placing a board between your mattress and box spring.  Listen to your body when lifting.If you are experiencing pain, ask for help or try bending yourknees more so you can use your leg muscles rather than your back muscles. Squat down when picking up something from the floor. Do not bend over.  Eat a healthy diet. Try to gain weight within your caregiver's recommendations.  Use heat or cold packs 3 to 4 times a day for 15 minutes to help with the pain.  Only take over-the-counter or prescription medicines for pain, discomfort, or fever as directed by your caregiver. Sudden (acute) back pain  Use bed rest for only the most extreme, acute episodes of back pain. Prolonged bed rest over 48 hours will aggravate your condition.  Ice is very effective for acute conditions.  Put ice in a plastic bag.  Place a towel between your skin and the bag.  Leave the ice on for 10 to 20 minutes every 2 hours, or as needed.  Using heat packs for 30 minutes prior to activities is also helpful. Continued back pain See your caregiver if you have continued problems. Your caregiver can help or refer you for appropriate physical therapy. With conditioning, most back problems can be avoided. Sometimes, a more serious issue may be the cause of back pain. You should be seen right away if new problems seem to be developing. Your caregiver may recommend:  A maternity girdle.  An elastic sling.  A back brace.  A massage therapist or acupuncture. SEEK MEDICAL CARE IF:   You are not able to do most of your daily activities, even when taking the pain medicine you were given.  You need a referral to a physical therapist or chiropractor.  You want to try acupuncture. SEEK IMMEDIATE MEDICAL CARE  IF:  You develop numbness, tingling, weakness, or problems with the use of your arms or legs.  You develop severe back pain that is no longer relieved with medicines.  You have a sudden change in bowel or bladder control.  You have increasing pain in other areas of the body.  You develop shortness of breath, dizziness, or fainting.  You develop nausea, vomiting, or sweating.  You have back pain which is similar to labor pains.  You have back pain along with your water breaking or vaginal bleeding.  You have back pain or numbness that travels down your leg.  Your back pain developed after you fell.  You develop pain on one side of your back. You may have a kidney stone.  You see blood in your urine. You may have a bladder infection or kidney stone.  You have back pain with blisters. You may have shingles. Back pain is fairly common during pregnancy but should not be accepted as just part of   the process. Back pain should always be treated as soon as possible. This will make your pregnancy as pleasant as possible. Document Released: 01/30/2006 Document Revised: 01/14/2012 Document Reviewed: 03/13/2011 Exit[employer] Patient Information 2013 [employer]. Fetal Movement Counts Patient Name: __________________________________________________ Patient Due Date: ____________________ Kick counts is highly recommended in high risk pregnancies, but it is a good idea for every pregnant woman to do. Start counting fetal movements at 28 weeks of the pregnancy. Fetal movements increase after eating a full meal or eating or drinking something sweet (the blood sugar is higher). It is also important to drink plenty of fluids (well hydrated) before doing the count. Lie on your left side because it helps with the circulation or you can sit in a comfortable chair with your arms over your belly (abdomen) with no distractions around you. DOING THE COUNT  Try to do the count the same time of day each time  you do it.  Mark the day and time, then see how long it takes for you to feel 10 movements (kicks, flutters, swishes, rolls). You should have at least 10 movements within 2 hours. You will most likely feel 10 movements in much less than 2 hours. If you do not, wait an hour and count again. After a couple of days you will see a pattern.  What you are looking for is a change in the pattern or not enough counts in 2 hours. Is it taking longer in time to reach 10 movements? SEEK MEDICAL CARE IF:  You feel less than 10 counts in 2 hours. Tried twice.  No movement in one hour.  The pattern is changing or taking longer each day to reach 10 counts in 2 hours.  You feel the baby is not moving as it usually does. Date: ____________ Movements: ____________ Start time: ____________ Finish time: ____________  Date: ____________ Movements: ____________ Start time: ____________ Finish time: ____________ Date: ____________ Movements: ____________ Start time: ____________ Finish time: ____________ Date: ____________ Movements: ____________ Start time: ____________ Finish time: ____________ Date: ____________ Movements: ____________ Start time: ____________ Finish time: ____________ Date: ____________ Movements: ____________ Start time: ____________ Finish time: ____________ Date: ____________ Movements: ____________ Start time: ____________ Finish time: ____________ Date: ____________ Movements: ____________ Start time: ____________ Finish time: ____________  Date: ____________ Movements: ____________ Start time: ____________ Finish time: ____________ Date: ____________ Movements: ____________ Start time: ____________ Finish time: ____________ Date: ____________ Movements: ____________ Start time: ____________ Finish time: ____________ Date: ____________ Movements: ____________ Start time: ____________ Finish time: ____________ Date: ____________ Movements: ____________ Start time: ____________ Finish time:  ____________ Date: ____________ Movements: ____________ Start time: ____________ Finish time: ____________ Date: ____________ Movements: ____________ Start time: ____________ Finish time: ____________  Date: ____________ Movements: ____________ Start time: ____________ Finish time: ____________ Date: ____________ Movements: ____________ Start time: ____________ Finish time: ____________ Date: ____________ Movements: ____________ Start time: ____________ Finish time: ____________ Date: ____________ Movements: ____________ Start time: ____________ Finish time: ____________ Date: ____________ Movements: ____________ Start time: ____________ Finish time: ____________ Date: ____________ Movements: ____________ Start time: ____________ Finish time: ____________ Date: ____________ Movements: ____________ Start time: ____________ Finish time: ____________  Date: ____________ Movements: ____________ Start time: ____________ Finish time: ____________ Date: ____________ Movements: ____________ Start time: ____________ Finish time: ____________ Date: ____________ Movements: ____________ Start time: ____________ Finish time: ____________ Date: ____________ Movements: ____________ Start time: ____________ Finish time: ____________ Date: ____________ Movements: ____________ Start time: ____________ Finish time: ____________ Date: ____________ Movements: ____________ Start time: ____________ Finish time: ____________ Date: ____________ Movements: ____________ Start time: ____________ Finish   time: ____________  Date: ____________ Movements: ____________ Start time: ____________ Finish time: ____________ Date: ____________ Movements: ____________ Start time: ____________ Finish time: ____________ Date: ____________ Movements: ____________ Start time: ____________ Finish time: ____________ Date: ____________ Movements: ____________ Start time: ____________ Finish time: ____________ Date: ____________ Movements:  ____________ Start time: ____________ Finish time: ____________ Date: ____________ Movements: ____________ Start time: ____________ Finish time: ____________ Date: ____________ Movements: ____________ Start time: ____________ Finish time: ____________  Date: ____________ Movements: ____________ Start time: ____________ Finish time: ____________ Date: ____________ Movements: ____________ Start time: ____________ Finish time: ____________ Date: ____________ Movements: ____________ Start time: ____________ Finish time: ____________ Date: ____________ Movements: ____________ Start time: ____________ Finish time: ____________ Date: ____________ Movements: ____________ Start time: ____________ Finish time: ____________ Date: ____________ Movements: ____________ Start time: ____________ Finish time: ____________ Date: ____________ Movements: ____________ Start time: ____________ Finish time: ____________  Date: ____________ Movements: ____________ Start time: ____________ Finish time: ____________ Date: ____________ Movements: ____________ Start time: ____________ Finish time: ____________ Date: ____________ Movements: ____________ Start time: ____________ Finish time: ____________ Date: ____________ Movements: ____________ Start time: ____________ Finish time: ____________ Date: ____________ Movements: ____________ Start time: ____________ Finish time: ____________ Date: ____________ Movements: ____________ Start time: ____________ Finish time: ____________ Date: ____________ Movements: ____________ Start time: ____________ Finish time: ____________  Date: ____________ Movements: ____________ Start time: ____________ Finish time: ____________ Date: ____________ Movements: ____________ Start time: ____________ Finish time: ____________ Date: ____________ Movements: ____________ Start time: ____________ Finish time: ____________ Date: ____________ Movements: ____________ Start time: ____________ Finish  time: ____________ Date: ____________ Movements: ____________ Start time: ____________ Finish time: ____________ Date: ____________ Movements: ____________ Start time: ____________ Finish time: ____________ Document Released: 11/21/2006 Document Revised: 01/14/2012 Document Reviewed: 05/24/2009 Exit[employer] Patient Information 2013 [employer].   

## 2012-09-03 NOTE — Progress Notes (Signed)
[redacted]w[redacted]d A/P GBS positive Fetal kick counts reviewed Labor reviewed with pt All patients  questions answered S>d [place] @ [place] with bpp for obesity

## 2012-09-11 ENCOUNTER — Other Ambulatory Visit: Payer: Self-pay | Admitting: Obstetrics and Gynecology

## 2012-09-11 ENCOUNTER — Ambulatory Visit (INDEPENDENT_AMBULATORY_CARE_PROVIDER_SITE_OTHER): Payer: PRIVATE HEALTH INSURANCE | Admitting: Obstetrics and Gynecology

## 2012-09-11 ENCOUNTER — Ambulatory Visit (INDEPENDENT_AMBULATORY_CARE_PROVIDER_SITE_OTHER): Payer: PRIVATE HEALTH INSURANCE

## 2012-09-11 VITALS — BP 124/80 | Wt 313.0 lb

## 2012-09-11 DIAGNOSIS — O3660X0 Maternal care for excessive fetal growth, unspecified trimester, not applicable or unspecified: Secondary | ICD-10-CM

## 2012-09-11 DIAGNOSIS — Z331 Pregnant state, incidental: Secondary | ICD-10-CM

## 2012-09-11 NOTE — Progress Notes (Signed)
[redacted]w[redacted]d Ultrasound shows:         [place] EDD: 09/06/2012            AFI: 16.1cm               Placenta localization: posterior            Fetal presentation: Vertex                  Comments:  Normal fluid: AFI of 16.1 cm = 65th%tile 9 lbs 4 oz +/- 13 oz (4218 grams)    NOTE: This could be a macrosomic baby at 4500 grams.  BPP8/8 Long discussion concerning options for management of LGA baby.  Await spontaneous labor until 41 weeks then induce vs primary c/s.  Risks and benefiits of each reviewed.  Pt wants to proceed with cesarean section. Will schedule for 40 wks.      Pt desires cervix check

## 2012-09-12 ENCOUNTER — Other Ambulatory Visit: Payer: Self-pay | Admitting: Obstetrics and Gynecology

## 2012-09-12 ENCOUNTER — Encounter (HOSPITAL_COMMUNITY): Payer: Self-pay | Admitting: *Deleted

## 2012-09-12 ENCOUNTER — Encounter (HOSPITAL_COMMUNITY): Payer: Self-pay | Admitting: Pharmacist

## 2012-09-12 ENCOUNTER — Telehealth: Payer: Self-pay | Admitting: Obstetrics and Gynecology

## 2012-09-12 NOTE — Telephone Encounter (Signed)
Primary C/S scheduled for 09/15/12 @ 9:15 with SR/VL. -Adrianne Pridgen

## 2012-09-14 MED ORDER — DEXTROSE 5 % IV SOLN
3.0000 g | INTRAVENOUS | Status: DC
  Filled 2012-09-14: qty 3000

## 2012-09-14 NOTE — H&P (Signed)
Alyssa Spence is a 36 y.o. female, G1P0 at 77 6/7 weeks, presenting for scheduled primary Cesarean delivery due to suspected macrosomia, with EFW 9+4.  Patient denies leaking, bleeding, ROM, and notes + FM.  Patient Active Problem List  Diagnosis  . History of abnormal Pap smear - LGSIL, colpo in Jan 2013  . Obesity  . Pregnant state, incidental  . LGA (large for gestational age) fetus affecting management of mother  (Last maternal weight 313 at LV.)  History of present pregnancy: Patient entered care at 10 3/7 weeks.   EDC of 09/16/12 was established by LMP and 1st trimester [place].   Anatomy scan:  18 weeks, with normal findings and an posterior placenta.   Additional [place] evaluations:  37 weeks due to S>D, with EFW at 80.8%ile and normal fluid; 39 weeks, with EFW 9+4 (+/- 13 oz) Significant prenatal events:  Normal 1st trimester screen and AFP; Pap at [facility] showed LGSIL, with colpo at 18 weeks WNL--plan made for repeat colpo pp; positive GBS in 3rd trimester.  Declined flu shot. Last evaluation:  Cervix closed, thick, vtx ballotable.  US showing LGA fetus.  Dr. Pennie Rushing discussed the options for management, including induction at 41 if no spontaneous labor, or elective C/S, with R&B of each plan reviewed. Patient elected primary C/S.  OB History    Grav Para Term Preterm Abortions TAB SAB Ect Mult Living   1              Past Medical History  Diagnosis Date  . History of chicken pox   . Yeast infection   . Bacterial infection   . Abnormal Pap smear 2008,10/2011    colpo,2008 & colpo, 2012 : NI  . Hx of constipation   . GERD (gastroesophageal reflux disease)     only with pregnancy-no meds  . Macrosomia    Past Surgical History  Procedure Date  . Wisdom tooth extraction 10/1996  . Fasciotomy 02/2009    left foot   Family History: family history includes Cancer in her maternal grandmother and paternal grandmother; Heart disease in her paternal grandfather; and Hypertension in her  maternal grandmother.  Social History:  reports that she has never smoked. She has never used smokeless tobacco. She reports that she does not drink alcohol or use illicit drugs.  Married to FOB, Molli Hazard, who is involved and supportive.  Patient is HS educated and employed as a Clinical biochemist at [employer].  Husband is HS educated and employed in trucking.  Patient is [place] and of the [place] faith.   Prenatal Transfer Tool  Maternal Diabetes: No Genetic Screening: Normal Maternal Ultrasounds/Referrals: Normal Fetal Ultrasounds or other Referrals:  Other:  Anticipated LGA infant Maternal Substance Abuse:  No Significant Maternal Medications:  None Significant Maternal Lab Results: Lab values include: Group B Strep positive  ROS:  Occasional contractions, +FM  No Known Allergies    Height 5\' 6"  (1.676 m), weight 313 lb (141.976 kg), last menstrual period 12/11/2011.  Chest clear Heart RRR without murmur Abd gravid, NT, FH 44-45 cm Pelvic: Deferred at present Ext: WNL  FHR: 150s per doppler in office UCs:  None at present  Prenatal labs: ABO, Rh: O/Positive/-- (04/02 0000) Antibody: Negative (04/02 0000) Rubella:   Immune RPR: NON REAC (08/22 1617)  HBsAg: Negative (04/02 0000)  HIV: Non-reactive (04/02 0000)  GBS: POSITIVE (10/17 1729) Sickle cell/Hgb electrophoresis:  NA Pap:  LGSIL 02/2012, with colpo at 18 weeks, WNL--no bx.  Plan for repeat  pp. GC:  Negative at NOB Chlamydia:  Negative at [facility] Genetic screenings:  Normal 1st trimester screen and AFP Glucola:  134 at 28 weeks, with 1 abnormal value on 3 hour GTT Other:  Hgb 15.5 at NO[facility], 12.2 at 28 weeks    Assessment/Plan: IUP at 39 6/7 weeks Suspected LGA infant--patient elects primary C/S Positive GBS Abnormal pap, with plan for colpo pp Elevated BMI  Plan: Admitted to [facility] per consult with Dr. Estanislado Pandy Routine pre-op orders R&B of C/S reviewed again with patient, including bleeding infection, and damage to  other organs.  Patient seems to understand these risks and wishes to proceed.   Alyssa Spence, [place], [place] 09/14/2012, 9:22 PM

## 2012-09-15 ENCOUNTER — Encounter (HOSPITAL_COMMUNITY)
Admission: AD | Disposition: A | Discharge status: Home / Self Care | Payer: Self-pay | Source: Ambulatory Visit | Attending: Obstetrics and Gynecology

## 2012-09-15 ENCOUNTER — Inpatient Hospital Stay (HOSPITAL_COMMUNITY): Payer: PRIVATE HEALTH INSURANCE | Admitting: Anesthesiology

## 2012-09-15 ENCOUNTER — Encounter (HOSPITAL_COMMUNITY): Payer: Self-pay | Admitting: *Deleted

## 2012-09-15 ENCOUNTER — Inpatient Hospital Stay (HOSPITAL_COMMUNITY)
Admission: AD | Admit: 2012-09-15 | Discharge: 2012-09-17 | DRG: 766 | Disposition: A | Discharge status: Home / Self Care | Payer: PRIVATE HEALTH INSURANCE | Source: Ambulatory Visit | Attending: Obstetrics and Gynecology | Admitting: Obstetrics and Gynecology

## 2012-09-15 ENCOUNTER — Encounter (HOSPITAL_COMMUNITY): Payer: Self-pay | Admitting: Anesthesiology

## 2012-09-15 DIAGNOSIS — O99892 Other specified diseases and conditions complicating childbirth: Secondary | ICD-10-CM | POA: Diagnosis present

## 2012-09-15 DIAGNOSIS — O3660X0 Maternal care for excessive fetal growth, unspecified trimester, not applicable or unspecified: Principal | ICD-10-CM | POA: Diagnosis present

## 2012-09-15 DIAGNOSIS — O99214 Obesity complicating childbirth: Secondary | ICD-10-CM | POA: Diagnosis present

## 2012-09-15 DIAGNOSIS — Z98891 History of uterine scar from previous surgery: Secondary | ICD-10-CM | POA: Diagnosis present

## 2012-09-15 DIAGNOSIS — E669 Obesity, unspecified: Secondary | ICD-10-CM

## 2012-09-15 DIAGNOSIS — O9902 Anemia complicating childbirth: Secondary | ICD-10-CM | POA: Diagnosis present

## 2012-09-15 DIAGNOSIS — Z22330 Carrier of Group B streptococcus: Secondary | ICD-10-CM

## 2012-09-15 DIAGNOSIS — D649 Anemia, unspecified: Secondary | ICD-10-CM | POA: Diagnosis present

## 2012-09-15 DIAGNOSIS — Z87898 Personal history of other specified conditions: Secondary | ICD-10-CM

## 2012-09-15 HISTORY — DX: Gastro-esophageal reflux disease without esophagitis: K21.9

## 2012-09-15 LAB — CBC
MCH: 30.3 pg (ref 26.0–34.0)
MCHC: 34.5 g/dL (ref 30.0–36.0)
Platelets: 258 10*3/uL (ref 150–400)
RBC: 4.32 MIL/uL (ref 3.87–5.11)

## 2012-09-15 LAB — RPR: RPR Ser Ql: NONREACTIVE

## 2012-09-15 LAB — SURGICAL PCR SCREEN: Staphylococcus aureus: POSITIVE — AB

## 2012-09-15 LAB — PREPARE RBC (CROSSMATCH)

## 2012-09-15 LAB — ABO/RH: ABO/RH(D): O POS

## 2012-09-15 SURGERY — Surgical Case
Anesthesia: Spinal | Site: Abdomen | Wound class: Clean Contaminated

## 2012-09-15 MED ORDER — SCOPOLAMINE 1 MG/3DAYS TD PT72
1.0000 | MEDICATED_PATCH | Freq: Once | TRANSDERMAL | Status: DC
  Filled 2012-09-15: qty 1

## 2012-09-15 MED ORDER — METHYLERGONOVINE MALEATE 0.2 MG/ML IJ SOLN: 0.2000 mg | INTRAMUSCULAR | Status: DC | PRN

## 2012-09-15 MED ORDER — PHENYLEPHRINE HCL 10 MG/ML IJ SOLN
INTRAMUSCULAR | Status: DC | PRN
  Administered 2012-09-15: 80 ug via INTRAVENOUS
  Administered 2012-09-15: 40 ug via INTRAVENOUS
  Administered 2012-09-15: 80 ug via INTRAVENOUS
  Administered 2012-09-15 (×2): 40 ug via INTRAVENOUS
  Administered 2012-09-15 (×2): 80 ug via INTRAVENOUS
  Administered 2012-09-15: 40 ug via INTRAVENOUS
  Administered 2012-09-15 (×2): 80 ug via INTRAVENOUS
  Administered 2012-09-15: 40 ug via INTRAVENOUS
  Administered 2012-09-15 (×2): 80 ug via INTRAVENOUS

## 2012-09-15 MED ORDER — ONDANSETRON HCL 4 MG/2ML IJ SOLN: 4.0000 mg | Freq: Three times a day (TID) | INTRAMUSCULAR | Status: DC | PRN

## 2012-09-15 MED ORDER — MORPHINE SULFATE 0.5 MG/ML IJ SOLN
INTRAMUSCULAR | Status: AC
Start: 2012-09-15 — End: 2012-09-15
  Filled 2012-09-15: qty 10

## 2012-09-15 MED ORDER — DIPHENHYDRAMINE HCL 25 MG PO CAPS: 25.0000 mg | ORAL_CAPSULE | Freq: Four times a day (QID) | ORAL | Status: DC | PRN

## 2012-09-15 MED ORDER — BUPIVACAINE IN DEXTROSE 0.75-8.25 % IT SOLN
INTRATHECAL | Status: DC | PRN
  Administered 2012-09-15: 1.6 mL via INTRATHECAL

## 2012-09-15 MED ORDER — NALBUPHINE HCL 10 MG/ML IJ SOLN
5.0000 mg | INTRAMUSCULAR | Status: DC | PRN
  Filled 2012-09-15: qty 1

## 2012-09-15 MED ORDER — IBUPROFEN 600 MG PO TABS
600.0000 mg | ORAL_TABLET | Freq: Four times a day (QID) | ORAL | Status: DC
  Administered 2012-09-15 – 2012-09-17 (×7): 600 mg via ORAL
  Filled 2012-09-15 (×8): qty 1

## 2012-09-15 MED ORDER — SODIUM CHLORIDE 0.9 % IV SOLN
1.0000 ug/kg/h | INTRAVENOUS | Status: DC | PRN
  Filled 2012-09-15: qty 2.5

## 2012-09-15 MED ORDER — MENTHOL 3 MG MT LOZG: 1.0000 | LOZENGE | OROMUCOSAL | Status: DC | PRN

## 2012-09-15 MED ORDER — SCOPOLAMINE 1 MG/3DAYS TD PT72
1.0000 | MEDICATED_PATCH | Freq: Once | TRANSDERMAL | Status: DC
  Administered 2012-09-15: 1.5 mg via TRANSDERMAL

## 2012-09-15 MED ORDER — EPHEDRINE SULFATE 50 MG/ML IJ SOLN
INTRAMUSCULAR | Status: DC | PRN
  Administered 2012-09-15: 20 mg via INTRAVENOUS
  Administered 2012-09-15: 15 mg via INTRAVENOUS
  Administered 2012-09-15 (×2): 5 mg via INTRAVENOUS

## 2012-09-15 MED ORDER — WITCH HAZEL-GLYCERIN EX PADS: 1.0000 "application " | MEDICATED_PAD | CUTANEOUS | Status: DC | PRN

## 2012-09-15 MED ORDER — EPHEDRINE 5 MG/ML INJ
INTRAVENOUS | Status: AC
  Filled 2012-09-15: qty 10

## 2012-09-15 MED ORDER — BUPIVACAINE HCL (PF) 0.25 % IJ SOLN
INTRAMUSCULAR | Status: AC
Start: 2012-09-15 — End: 2012-09-15
  Filled 2012-09-15: qty 30

## 2012-09-15 MED ORDER — NALOXONE HCL 0.4 MG/ML IJ SOLN: 0.4000 mg | INTRAMUSCULAR | Status: DC | PRN

## 2012-09-15 MED ORDER — SIMETHICONE 80 MG PO CHEW
80.0000 mg | CHEWABLE_TABLET | Freq: Three times a day (TID) | ORAL | Status: DC
  Administered 2012-09-15 – 2012-09-17 (×7): 80 mg via ORAL

## 2012-09-15 MED ORDER — TETANUS-DIPHTH-ACELL PERTUSSIS 5-2.5-18.5 LF-MCG/0.5 IM SUSP
0.5000 mL | Freq: Once | INTRAMUSCULAR | Status: AC
  Administered 2012-09-15: 0.5 mL via INTRAMUSCULAR
  Filled 2012-09-15: qty 0.5

## 2012-09-15 MED ORDER — OXYTOCIN 10 UNIT/ML IJ SOLN
40.0000 [IU] | INTRAVENOUS | Status: DC | PRN
  Administered 2012-09-15: 40 [IU] via INTRAVENOUS

## 2012-09-15 MED ORDER — NALBUPHINE SYRINGE 5 MG/0.5 ML
INJECTION | INTRAMUSCULAR | Status: AC
  Administered 2012-09-15: 10 mg via SUBCUTANEOUS
  Filled 2012-09-15: qty 1

## 2012-09-15 MED ORDER — FENTANYL CITRATE 0.05 MG/ML IJ SOLN: 25.0000 ug | INTRAMUSCULAR | Status: DC | PRN

## 2012-09-15 MED ORDER — MEPERIDINE HCL 25 MG/ML IJ SOLN: 6.2500 mg | INTRAMUSCULAR | Status: DC | PRN

## 2012-09-15 MED ORDER — METHYLERGONOVINE MALEATE 0.2 MG PO TABS: 0.2000 mg | ORAL_TABLET | ORAL | Status: DC | PRN

## 2012-09-15 MED ORDER — ZOLPIDEM TARTRATE 5 MG PO TABS: 5.0000 mg | ORAL_TABLET | Freq: Every evening | ORAL | Status: DC | PRN

## 2012-09-15 MED ORDER — FENTANYL CITRATE 0.05 MG/ML IJ SOLN
INTRAMUSCULAR | Status: DC | PRN
  Administered 2012-09-15: 25 ug via INTRATHECAL

## 2012-09-15 MED ORDER — ONDANSETRON HCL 4 MG/2ML IJ SOLN
INTRAMUSCULAR | Status: AC
  Filled 2012-09-15: qty 2

## 2012-09-15 MED ORDER — LACTATED RINGERS IV SOLN
Freq: Once | INTRAVENOUS | Status: AC
  Administered 2012-09-15: 09:00:00 via INTRAVENOUS

## 2012-09-15 MED ORDER — MIDAZOLAM HCL 2 MG/2ML IJ SOLN: 0.5000 mg | Freq: Once | INTRAMUSCULAR | Status: DC | PRN

## 2012-09-15 MED ORDER — LACTATED RINGERS IV SOLN
INTRAVENOUS | Status: DC | PRN
  Administered 2012-09-15: 10:00:00 via INTRAVENOUS

## 2012-09-15 MED ORDER — DIPHENHYDRAMINE HCL 25 MG PO CAPS: 25.0000 mg | ORAL_CAPSULE | ORAL | Status: DC | PRN

## 2012-09-15 MED ORDER — DIPHENHYDRAMINE HCL 50 MG/ML IJ SOLN: 12.5000 mg | INTRAMUSCULAR | Status: DC | PRN

## 2012-09-15 MED ORDER — ONDANSETRON HCL 4 MG/2ML IJ SOLN
INTRAMUSCULAR | Status: DC | PRN
  Administered 2012-09-15: 4 mg via INTRAVENOUS

## 2012-09-15 MED ORDER — SENNOSIDES-DOCUSATE SODIUM 8.6-50 MG PO TABS
2.0000 | ORAL_TABLET | Freq: Every day | ORAL | Status: DC
  Administered 2012-09-15 – 2012-09-16 (×2): 2 via ORAL

## 2012-09-15 MED ORDER — METOCLOPRAMIDE HCL 5 MG/ML IJ SOLN: 10.0000 mg | Freq: Three times a day (TID) | INTRAMUSCULAR | Status: DC | PRN

## 2012-09-15 MED ORDER — INFLUENZA VIRUS VACC SPLIT PF IM SUSP: 0.5000 mL | INTRAMUSCULAR | Status: DC

## 2012-09-15 MED ORDER — ATROPINE SULFATE 0.4 MG/ML IJ SOLN
INTRAMUSCULAR | Status: AC
  Filled 2012-09-15: qty 1

## 2012-09-15 MED ORDER — MEASLES, MUMPS & RUBELLA VAC ~~LOC~~ INJ: 0.5000 mL | INJECTION | Freq: Once | SUBCUTANEOUS | Status: DC

## 2012-09-15 MED ORDER — NALBUPHINE HCL 10 MG/ML IJ SOLN
5.0000 mg | INTRAMUSCULAR | Status: DC | PRN
Start: 2012-09-15 — End: 2012-09-17
  Administered 2012-09-15: 10 mg via SUBCUTANEOUS
  Filled 2012-09-15: qty 1

## 2012-09-15 MED ORDER — DIBUCAINE 1 % RE OINT: 1.0000 "application " | TOPICAL_OINTMENT | RECTAL | Status: DC | PRN

## 2012-09-15 MED ORDER — LACTATED RINGERS IV SOLN: INTRAVENOUS | Status: DC

## 2012-09-15 MED ORDER — PROMETHAZINE HCL 25 MG/ML IJ SOLN: 6.2500 mg | INTRAMUSCULAR | Status: DC | PRN

## 2012-09-15 MED ORDER — MUPIROCIN 2 % EX OINT: TOPICAL_OINTMENT | Freq: Two times a day (BID) | CUTANEOUS | Status: DC

## 2012-09-15 MED ORDER — KETOROLAC TROMETHAMINE 30 MG/ML IJ SOLN
30.0000 mg | Freq: Four times a day (QID) | INTRAMUSCULAR | Status: AC | PRN
  Administered 2012-09-15: 30 mg via INTRAVENOUS

## 2012-09-15 MED ORDER — OXYTOCIN 10 UNIT/ML IJ SOLN
INTRAMUSCULAR | Status: AC
  Filled 2012-09-15: qty 1

## 2012-09-15 MED ORDER — PRENATAL MULTIVITAMIN CH
1.0000 | ORAL_TABLET | Freq: Every day | ORAL | Status: DC
  Administered 2012-09-16: 1 via ORAL
  Filled 2012-09-15: qty 1

## 2012-09-15 MED ORDER — DIPHENHYDRAMINE HCL 50 MG/ML IJ SOLN: 25.0000 mg | INTRAMUSCULAR | Status: DC | PRN

## 2012-09-15 MED ORDER — ACETAMINOPHEN 10 MG/ML IV SOLN
1000.0000 mg | Freq: Four times a day (QID) | INTRAVENOUS | Status: AC | PRN
  Filled 2012-09-15: qty 100

## 2012-09-15 MED ORDER — SIMETHICONE 80 MG PO CHEW: 80.0000 mg | CHEWABLE_TABLET | ORAL | Status: DC | PRN

## 2012-09-15 MED ORDER — FERROUS SULFATE 325 (65 FE) MG PO TABS
325.0000 mg | ORAL_TABLET | Freq: Two times a day (BID) | ORAL | Status: DC
  Administered 2012-09-15 – 2012-09-17 (×4): 325 mg via ORAL
  Filled 2012-09-15 (×4): qty 1

## 2012-09-15 MED ORDER — SCOPOLAMINE 1 MG/3DAYS TD PT72
MEDICATED_PATCH | TRANSDERMAL | Status: AC
  Administered 2012-09-15: 1.5 mg via TRANSDERMAL
  Filled 2012-09-15: qty 1

## 2012-09-15 MED ORDER — OXYTOCIN 40 UNITS IN LACTATED RINGERS INFUSION - SIMPLE MED: 62.5000 mL/h | INTRAVENOUS | Status: AC

## 2012-09-15 MED ORDER — DEXTROSE 5 % IV SOLN
3.0000 g | INTRAVENOUS | Status: DC | PRN
  Administered 2012-09-15: 3 g via INTRAVENOUS

## 2012-09-15 MED ORDER — ONDANSETRON HCL 4 MG PO TABS: 4.0000 mg | ORAL_TABLET | ORAL | Status: DC | PRN

## 2012-09-15 MED ORDER — FENTANYL CITRATE 0.05 MG/ML IJ SOLN
INTRAMUSCULAR | Status: AC
  Filled 2012-09-15: qty 2

## 2012-09-15 MED ORDER — LANOLIN HYDROUS EX OINT: 1.0000 "application " | TOPICAL_OINTMENT | CUTANEOUS | Status: DC | PRN

## 2012-09-15 MED ORDER — OXYCODONE-ACETAMINOPHEN 5-325 MG PO TABS
1.0000 | ORAL_TABLET | ORAL | Status: DC | PRN
  Administered 2012-09-16 – 2012-09-17 (×4): 1 via ORAL
  Filled 2012-09-15 (×3): qty 1

## 2012-09-15 MED ORDER — SODIUM CHLORIDE 0.9 % IJ SOLN: 3.0000 mL | INTRAMUSCULAR | Status: DC | PRN

## 2012-09-15 MED ORDER — MORPHINE SULFATE (PF) 0.5 MG/ML IJ SOLN
INTRAMUSCULAR | Status: DC | PRN
  Administered 2012-09-15: .1 mg via INTRATHECAL

## 2012-09-15 MED ORDER — ONDANSETRON HCL 4 MG/2ML IJ SOLN: 4.0000 mg | INTRAMUSCULAR | Status: DC | PRN

## 2012-09-15 MED ORDER — LACTATED RINGERS IV SOLN
INTRAVENOUS | Status: DC | PRN
  Administered 2012-09-15 (×3): via INTRAVENOUS

## 2012-09-15 MED ORDER — BUPIVACAINE HCL (PF) 0.25 % IJ SOLN
INTRAMUSCULAR | Status: DC | PRN
  Administered 2012-09-15: 20 mL

## 2012-09-15 MED ORDER — KETOROLAC TROMETHAMINE 30 MG/ML IJ SOLN: 30.0000 mg | Freq: Four times a day (QID) | INTRAMUSCULAR | Status: AC | PRN

## 2012-09-15 MED ORDER — PHENYLEPHRINE 40 MCG/ML (10ML) SYRINGE FOR IV PUSH (FOR BLOOD PRESSURE SUPPORT)
PREFILLED_SYRINGE | INTRAVENOUS | Status: AC
  Filled 2012-09-15: qty 10

## 2012-09-15 MED ORDER — KETOROLAC TROMETHAMINE 30 MG/ML IJ SOLN
INTRAMUSCULAR | Status: AC
  Administered 2012-09-15: 30 mg via INTRAVENOUS
  Filled 2012-09-15: qty 1

## 2012-09-15 SURGICAL SUPPLY — 41 items
APL SKNCLS STERI-STRIP NONHPOA (GAUZE/BANDAGES/DRESSINGS) ×1
BENZOIN TINCTURE PRP APPL 2/3 (GAUZE/BANDAGES/DRESSINGS) ×2 IMPLANT
BOOTIES KNEE HIGH SLOAN (MISCELLANEOUS) ×4 IMPLANT
CLOTH BEACON ORANGE TIMEOUT ST (SAFETY) ×2 IMPLANT
DRAIN JACKSON PRT FLT 10 (DRAIN) ×1 IMPLANT
DRAPE SURG 17X23 STRL (DRAPES) ×2 IMPLANT
DRESSING TELFA 8X3 (GAUZE/BANDAGES/DRESSINGS) ×2 IMPLANT
DRSG COVADERM 4X10 (GAUZE/BANDAGES/DRESSINGS) ×1 IMPLANT
DURAPREP 26ML APPLICATOR (WOUND CARE) ×2 IMPLANT
ELECT REM PT RETURN 9FT ADLT (ELECTROSURGICAL) ×2
ELECTRODE REM PT RTRN 9FT ADLT (ELECTROSURGICAL) ×1 IMPLANT
EVACUATOR SILICONE 100CC (DRAIN) ×1 IMPLANT
EXTRACTOR VACUUM M CUP 4 TUBE (SUCTIONS) IMPLANT
GAUZE SPONGE 4X4 12PLY STRL LF (GAUZE/BANDAGES/DRESSINGS) ×4 IMPLANT
GLOVE BIOGEL PI IND STRL 7.0 (GLOVE) ×2 IMPLANT
GLOVE BIOGEL PI INDICATOR 7.0 (GLOVE) ×2
GLOVE ECLIPSE 6.5 STRL STRAW (GLOVE) ×2 IMPLANT
GOWN PREVENTION PLUS LG XLONG (DISPOSABLE) ×6 IMPLANT
KIT ABG SYR 3ML LUER SLIP (SYRINGE) IMPLANT
NDL HYPO 25X5/8 SAFETYGLIDE (NEEDLE) IMPLANT
NEEDLE HYPO 22GX1.5 SAFETY (NEEDLE) ×2 IMPLANT
NEEDLE HYPO 25X5/8 SAFETYGLIDE (NEEDLE) ×2 IMPLANT
NS IRRIG 1000ML POUR BTL (IV SOLUTION) ×4 IMPLANT
PACK C SECTION WH (CUSTOM PROCEDURE TRAY) ×2 IMPLANT
PAD ABD 7.5X8 STRL (GAUZE/BANDAGES/DRESSINGS) ×2 IMPLANT
PAD OB MATERNITY 4.3X12.25 (PERSONAL CARE ITEMS) IMPLANT
RETAINER VISCERAL (MISCELLANEOUS) ×1 IMPLANT
RTRCTR C-SECT PINK 25CM LRG (MISCELLANEOUS) ×2 IMPLANT
SLEEVE SCD COMPRESS KNEE MED (MISCELLANEOUS) ×1 IMPLANT
SPONGE GAUZE 4X4 12PLY (GAUZE/BANDAGES/DRESSINGS) ×1 IMPLANT
STRIP CLOSURE SKIN 1/2X4 (GAUZE/BANDAGES/DRESSINGS) ×2 IMPLANT
SUT CHROMIC GUT AB #0 18 (SUTURE) IMPLANT
SUT MNCRL AB 3-0 PS2 27 (SUTURE) ×2 IMPLANT
SUT SILK 2 0 FSL 18 (SUTURE) ×1 IMPLANT
SUT VIC AB 0 CTX 36 (SUTURE) ×4
SUT VIC AB 0 CTX36XBRD ANBCTRL (SUTURE) ×2 IMPLANT
SUT VIC AB 1 CT1 36 (SUTURE) ×4 IMPLANT
SYR 20CC LL (SYRINGE) ×2 IMPLANT
TOWEL OR 17X24 6PK STRL BLUE (TOWEL DISPOSABLE) ×4 IMPLANT
TRAY FOLEY CATH 14FR (SET/KITS/TRAYS/PACK) ×2 IMPLANT
WATER STERILE IRR 1000ML POUR (IV SOLUTION) ×2 IMPLANT

## 2012-09-15 NOTE — Op Note (Signed)
Preoperative diagnosis: Intrauterine pregnancy at 39 weeks and 6 days , suspected macrosomia  Post operative diagnosis: Same  Anesthesia: Spinal  Anesthesiologist: Dr. Dr Brayton Caves  Procedure: Primary low transverse cesarean section  Surgeon: Dr. Dois Davenport Takeyah Wieman  Assistant: Nigel Bridgeman  CNM  Estimated blood loss: 600 cc  Procedure:  After being informed of the planned procedure and possible complications including bleeding, infection, injury to other organs, informed consent is obtained. The patient is taken to OR #9 and given spinal anesthesia without complication. She is placed in the dorsal decubitus position with the pelvis tilted to the left. She is then prepped and draped in a sterile fashion. A Foley catheter is inserted in her bladder.  After assessing adequate level of anesthesia, we infiltrate the suprapubic area with 20 cc of Marcaine 0.25 and perform a Pfannenstiel incision which is brought down sharply to the fascia. The fascia is entered in a low transverse fashion. Linea alba is dissected. Peritoneum is entered in a midline fashion. An Alexis retractor is easily positioned.   The myometrium is then entered in a low transverse fashion, 2 cm above the vesico-uterine junction ; first with knife and then extended bluntly. Amniotic fluid is thin meconium stained. We assist the birth of a female  infant in vertex presentation. Mouth and nose are suctioned. The baby is delivered. The cord is clamped and sectioned. The baby is given to the neonatologist present in the room.  10 cc of blood is drawn from the umbilical vein.The placenta is allowed to deliver spontaneously. It is complete and the cord has 3 vessels. Uterine revision is negative.  We proceed with closure of the myometrium in 2 layers: First with a running locked suture of 0 Vicryl, then with a Lembert suture of 0 Vicryl imbricating the first one. Hemostasis is completed with cauterization on peritoneal edges.  Both  paracolic gutters are cleaned. Both tubes and ovaries are assessed and normal. The pelvis is profusely irrigated with warm saline to confirm a satisfactory hemostasis.  Retractors and sponges are removed. Under fascia hemostasis is completed with cauterization. The fascia is then closed with 2 running sutures of 0 Vicryl meeting midline. The wound is irrigated with warm saline and hemostasis is completed with cauterization. A #10 JP drain is placed in the incision and held in place with a 0 Silk suture.The skin is closed with a subcuticular suture of 3-0 Monocryl and Steri-Strips.  Instrument and sponge count is complete x2. Estimated blood loss is 600 cc.  The procedure is well tolerated by the patient who is taken to recovery room in a well and stable condition.  female baby named Kateri Plummer was born at 9:57 and received an Apgar of 8  at 1 minute and 9 at 5 minutes.    Specimen: Placenta sent to L & D   Kila Godina A MD 11/11/201310:51 AM

## 2012-09-15 NOTE — Anesthesia Procedure Notes (Signed)
Spinal  Patient location during procedure: OR Start time: 09/15/2012 9:23 AM Staffing Anesthesiologist: Brayton Caves R Performed by: anesthesiologist  Preanesthetic Checklist Completed: patient identified, site marked, surgical consent, pre-op evaluation, timeout performed, IV checked, risks and benefits discussed and monitors and equipment checked Spinal Block Patient position: sitting Prep: DuraPrep Patient monitoring: heart rate, cardiac monitor, continuous pulse ox and blood pressure Approach: midline Location: L3-4 Injection technique: single-shot Needle Needle type: Sprotte  Needle gauge: 24 G Needle length: 9 cm Assessment Sensory level: T4 Additional Notes Patient identified.  Risk benefits discussed including failed block, incomplete pain control, headache, nerve damage, paralysis, blood pressure changes, nausea, vomiting, reactions to medication both toxic or allergic, and postpartum back pain.  Patient expressed understanding and wished to proceed.  All questions were answered.  Sterile technique used throughout procedure.  CSF was clear.  No parasthesia or other complications.  Please see nursing notes for vital signs.

## 2012-09-15 NOTE — Interval H&P Note (Signed)
History and Physical Interval Note:  09/15/2012 8:43 AM  Alyssa Spence  has presented today for surgery, with the diagnosis of FETAL MACROSOMIA  The various methods of treatment have been discussed with the patient and family. After consideration of risks, benefits and other options for treatment, the patient has consented to  Procedure(s) (LRB) with comments: CESAREAN SECTION (N/A) - PRIMARY as a surgical intervention .  The patient's history has been reviewed, patient examined, no change in status, stable for surgery.  I have reviewed the patient's chart and labs.  Questions were answered to the patient's satisfaction.     [name] A

## 2012-09-15 NOTE — Transfer of Care (Signed)
Immediate Anesthesia Transfer of Care Note  Patient: Alyssa Spence  Procedure(s) Performed: Procedure(s) (LRB) with comments: CESAREAN SECTION (N/A) - PRIMARY  Patient Location: PACU  Anesthesia Type:Spinal  Level of Consciousness: awake, alert  and oriented  Airway & Oxygen Therapy: Patient Spontanous Breathing  Post-op Assessment: Report given to PACU RN  Post vital signs: Reviewed and stable  Complications: No apparent anesthesia complications

## 2012-09-15 NOTE — Anesthesia Postprocedure Evaluation (Signed)
Anesthesia Post Note  Patient: Alyssa Spence  Procedure(s) Performed: Procedure(s) (LRB): CESAREAN SECTION (N/A)  Anesthesia type: Spinal  Patient location: PACU  Post pain: Pain level controlled  Post assessment: Post-op Vital signs reviewed  Last Vitals:  Filed Vitals:   09/15/12 1130  BP:   Pulse:   Temp:   Resp: 20    Post vital signs: Reviewed  Level of consciousness: awake  Complications: No apparent anesthesia complications

## 2012-09-15 NOTE — Anesthesia Preprocedure Evaluation (Signed)
Anesthesia Evaluation  Patient identified by MRN, date of birth, ID band Patient awake    Reviewed: Allergy & Precautions, H&P , NPO status , Patient's Chart, lab work & pertinent test results  Airway Mallampati: IV      Dental No notable dental hx.    Pulmonary neg pulmonary ROS,  breath sounds clear to auscultation  Pulmonary exam normal       Cardiovascular Exercise Tolerance: Good negative cardio ROS  Rhythm:regular Rate:Normal     Neuro/Psych negative neurological ROS  negative psych ROS   GI/Hepatic negative GI ROS, Neg liver ROS, GERD-  Controlled,  Endo/Other  negative endocrine ROSMorbid obesity  Renal/GU negative Renal ROS  negative genitourinary   Musculoskeletal   Abdominal Normal abdominal exam  (+)   Peds  Hematology negative hematology ROS (+)   Anesthesia Other Findings History of chicken pox     Yeast infection        Bacterial infection     Abnormal Pap smear 2008,10/2011 colpo,2008 & colpo, 2012 : NI    Hx of constipation     GERD (gastroesophageal reflux disease)   only with pregnancy-no meds    Macrosomia    Reproductive/Obstetrics (+) Pregnancy                           Anesthesia Physical Anesthesia Plan  ASA: III  Anesthesia Plan: Spinal   Post-op Pain Management:    Induction:   Airway Management Planned:   Additional Equipment:   Intra-op Plan:   Post-operative Plan:   Informed Consent: I have reviewed the patients History and Physical, chart, labs and discussed the procedure including the risks, benefits and alternatives for the proposed anesthesia with the patient or authorized representative who has indicated his/her understanding and acceptance.     Plan Discussed with: Anesthesiologist, CRNA and Surgeon  Anesthesia Plan Comments:         Anesthesia Quick Evaluation

## 2012-09-16 ENCOUNTER — Encounter (HOSPITAL_COMMUNITY): Payer: Self-pay | Admitting: Obstetrics and Gynecology

## 2012-09-16 LAB — CBC
Platelets: 216 10*3/uL (ref 150–400)
RBC: 3.74 MIL/uL — ABNORMAL LOW (ref 3.87–5.11)
RDW: 14.2 % (ref 11.5–15.5)
WBC: 13 10*3/uL — ABNORMAL HIGH (ref 4.0–10.5)

## 2012-09-16 LAB — US OB FOLLOW UP

## 2012-09-16 MED ORDER — CHLORHEXIDINE GLUCONATE CLOTH 2 % EX PADS
6.0000 | MEDICATED_PAD | Freq: Every day | CUTANEOUS | Status: DC
  Administered 2012-09-16: 6 via TOPICAL

## 2012-09-16 MED ORDER — MUPIROCIN 2 % EX OINT
1.0000 "application " | TOPICAL_OINTMENT | Freq: Two times a day (BID) | CUTANEOUS | Status: DC
  Administered 2012-09-16 (×2): 1 via NASAL
  Filled 2012-09-16: qty 22

## 2012-09-16 NOTE — Addendum Note (Signed)
Addendum  created 09/16/12 0815 by Shanon Payor, CRNA   Modules edited:Notes Section

## 2012-09-16 NOTE — Progress Notes (Signed)
Subjective: Postpartum Day 1 Cesarean Delivery Primary Patient reports tolerating PO and no problems voiding no flatus, pain meds working well using motrin.   S: comfortable, little bleeding, slept     breastfeeding Hemoglobin & Hematocrit     Component Value Date/Time   HGB 11.2* 09/16/2012 0505   HCT 33.2* 09/16/2012 0505    Temp:  [97.3 F (36.3 C)-99.4 F (37.4 C)] 97.5 F (36.4 C) (11/12 0530) Pulse Rate:  [71-115] 80  (11/12 0530) Resp:  [16-20] 18  (11/12 0530) BP: (105-152)/(60-104) 107/74 mmHg (11/12 0530) SpO2:  [96 %-100 %] 96 % (11/12 0530) Weight:  [141.976 kg (313 lb)] 141.976 kg (313 lb) (11/11 1302)  Physical Exam:  General: alert, cooperative and distracted Lochia: appropriate Uterine Fundus: firm Incision: covered with dressing DVT Evaluation: Negative Homan's sign. Calf/Ankle edema is present.+1 bilaterally Lungs clear bilaterally AP RRR Bowel sounds hypoactive abd FF  Softly tympanic, dressing dry, JP drians serous     A normal involution     Lactating     PO day 1     Normal involution P encouraged ambulation,  Po water, percocet as needed, frequent voids, take dressing off in shower today, continue care Lavera Guise, CNM

## 2012-09-16 NOTE — Anesthesia Postprocedure Evaluation (Signed)
  Anesthesia Post-op Note  Patient: Alyssa Spence  Procedure(s) Performed: Procedure(s) (LRB) with comments: CESAREAN SECTION (N/A) - PRIMARY  Patient Location: Mother/Baby  Anesthesia Type:Spinal  Level of Consciousness: awake, alert  and oriented  Airway and Oxygen Therapy: Patient Spontanous Breathing  Post-op Pain: none  Post-op Assessment: Post-op Vital signs reviewed, Patient's Cardiovascular Status Stable, No headache, No backache, No residual numbness and No residual motor weakness  Post-op Vital Signs: Reviewed and stable  Complications: No apparent anesthesia complications

## 2012-09-17 MED ORDER — NORETHINDRONE 0.35 MG PO TABS: 1.0000 | ORAL_TABLET | Freq: Every day | ORAL | Status: AC

## 2012-09-17 MED ORDER — PROMETHAZINE HCL 12.5 MG PO TABS: 12.5000 mg | ORAL_TABLET | Freq: Four times a day (QID) | ORAL | Status: AC | PRN

## 2012-09-17 MED ORDER — HYDROCHLOROTHIAZIDE 12.5 MG PO TABS: 12.5000 mg | ORAL_TABLET | Freq: Every day | ORAL | Status: AC

## 2012-09-17 MED ORDER — OXYCODONE-ACETAMINOPHEN 5-325 MG PO TABS: 1.0000 | ORAL_TABLET | ORAL | Status: DC | PRN

## 2012-09-17 MED ORDER — IBUPROFEN 800 MG PO TABS: 800.0000 mg | ORAL_TABLET | Freq: Three times a day (TID) | ORAL | Status: AC | PRN

## 2012-09-17 NOTE — Discharge Summary (Addendum)
  Cesarean Section Delivery Discharge Summary  Alyssa Spence  DOB:    09-18-1976 MRN:    [id number] CSN:    [id number]  Date of admission:                  09/15/2012  Date of discharge:                   09/17/2012  Procedures this admission:  09/15/2012 primary low transverse cesarean section by Dr. Dois Davenport Rivard  Newborn Data:  Live born female  Birth Weight: 8 lb 13.5 oz (4010 g) APGAR: 8, 9  Home with mother. Name: Alyssa Spence  History of Present Illness:  Ms. Alyssa Spence is a 36 y.o. female, G1P1001, who presents at [redacted]w[redacted]d weeks gestation. The patient has been followed at the [facility] and Gynecology division of [facility]. She was admitted cesarean section. Her pregnancy has been complicated by: An abnormal Pap smear with colposcopy, positive beta strep, obesity, and suspected macrosomia. An ultrasound gave an estimated fetal weight of 9 pounds and 4 ounces. The patient was offered induction at 41 weeks and primary cesarean section. She elected primary cesarean section.  Hospital course:  The patient was admitted for for cesarean delivery. Her surgery was uncomplicated.   Her postpartum course was not complicated. Her JP drain was removed on post operative day 2. She was discharged to home on postpartum day 2 doing well. She tolerated a regular diet. She remained afebrile.  Feeding:  breast  Contraception:  Micronor  Discharge hemoglobin:  Hemoglobin  Date Value Range Status  09/16/2012 11.2* 12.0 - 15.0 g/dL Final     HCT  Date Value Range Status  09/16/2012 33.2* 36.0 - 46.0 % Final    Discharge Physical Exam:   General: no distress Lochia: appropriate Uterine Fundus: firm Incision: healing well DVT Evaluation: No evidence of DVT seen on physical exam.  JP drain removed.  Intrapartum Procedures: None Postpartum Procedures: None Complications-Operative and Postpartum: None  Discharge Diagnoses: Term  Pregnancy-delivered and Suspected macrosomia initially (large for gestational age infant), obesity, anemia, positive beta strep.  Discharge Information:  Activity:           pelvic rest Diet:                routine Medications: PNV, Ibuprofen, Colace, Iron, Percocet and Micronor, hydrochlorothiazide 12.5 mg daily for 2 weeks , Phenergan 12.5 mg every 6 hours as needed for nausea Condition:      stable and improved Instructions:  See epic instructions Discharge to: home  Follow-up Information    Follow up with [facility], MD. In 6 weeks.   Contact information:   [address] [place] [address] [phone]           Janine Limbo 09/17/2012

## 2012-09-18 LAB — TYPE AND SCREEN

## 2012-09-24 ENCOUNTER — Telehealth (HOSPITAL_COMMUNITY): Payer: Self-pay | Admitting: *Deleted

## 2012-09-24 NOTE — Telephone Encounter (Signed)
Resolve episode 

## 2012-10-17 ENCOUNTER — Emergency Department (INDEPENDENT_AMBULATORY_CARE_PROVIDER_SITE_OTHER)
Admission: EM | Admit: 2012-10-17 | Discharge: 2012-10-17 | Disposition: A | Discharge status: Home / Self Care | Payer: PRIVATE HEALTH INSURANCE | Source: Home / Self Care | Attending: Family Medicine | Admitting: Family Medicine

## 2012-10-17 ENCOUNTER — Encounter: Payer: Self-pay | Admitting: *Deleted

## 2012-10-17 DIAGNOSIS — J069 Acute upper respiratory infection, unspecified: Secondary | ICD-10-CM

## 2012-10-17 MED ORDER — AZITHROMYCIN 250 MG PO TABS: ORAL_TABLET | ORAL | Status: AC

## 2012-10-17 MED ORDER — BENZONATATE 200 MG PO CAPS: 200.0000 mg | ORAL_CAPSULE | Freq: Every day | ORAL | Status: AC

## 2012-10-17 NOTE — ED Notes (Addendum)
Patient c/o 2 days of productive cough, fever, congestion, sinus drainage and right ear pain. T-max 101.5. Taken tylenol @ 7am and Tylenol sinus/congestion. She delivered a baby about 1 month ago, she has stopped breastfeeding.

## 2012-10-17 NOTE — ED Provider Notes (Signed)
History     CSN: [id number]  Arrival date & time 10/17/12  7829   First MD Initiated Contact with Patient 10/17/12 [phone]      Chief Complaint  Patient presents with  . Cough  . Sinus Problem      HPI Comments: Patient complains of 3 day history of progressive URI symptoms beginning with a non-productive cough, followed by a mild sore throat (now improved).  She has also had mild nasal congestion.  Complains of fatigue and initial myalgias.  Cough is now worse at night and generally non-productive during the day.  There has been no pleuritic pain, shortness of breath, or wheezes, but she has tightness in her anterior chest.  Her husband developed a similar illness about 3 days prior to onset of her illness. Her Tdap and flu immunizations are current.  The history is provided by the patient.    Past Medical History  Diagnosis Date  . History of chicken pox   . Yeast infection   . Bacterial infection   . Abnormal Pap smear 2008,10/2011    colpo,2008 & colpo, 2012 : NI  . Hx of constipation   . GERD (gastroesophageal reflux disease)     only with pregnancy-no meds  . Macrosomia     Past Surgical History  Procedure Date  . Wisdom tooth extraction 10/1996  . Fasciotomy 02/2009    left foot  . Cesarean section 09/15/2012    Procedure: CESAREAN SECTION;  Surgeon: Esmeralda Arthur, MD;  Location: [facility];  Service: Obstetrics;  Laterality: N/A;  PRIMARY    Family History  Problem Relation Age of Onset  . Hypertension Maternal Grandmother   . Cancer Maternal Grandmother   . Cancer Paternal Grandmother   . Heart disease Paternal Grandfather     History  Substance Use Topics  . Smoking status: Never Smoker   . Smokeless tobacco: Never Used  . Alcohol Use: Yes    OB History    Grav Para Term Preterm Abortions TAB SAB Ect Mult Living   1 1 1       1       Review of Systems + sore throat + cough No pleuritic pain but has tightness in anterior chest No wheezing +  nasal congestion + post-nasal drainage No sinus pain/pressure No itchy/red eyes ? earache No hemoptysis No SOB + fever, + chills No nausea No vomiting No abdominal pain No diarrhea No urinary symptoms No skin rashes + fatigue + myalgias + headache Used OTC meds without relief  Allergies  Review of patient's allergies indicates no known allergies.  Home Medications   Current Outpatient Rx  Name  Route  Sig  Dispense  Refill  . ACETAMINOPHEN 325 MG PO TABS   Oral   Take 650 mg by mouth every 6 (six) hours as needed. For headache         . AZITHROMYCIN 250 MG PO TABS      Take 2 tabs today; then begin one tab once daily for 4 more days. (Rx void after 10/25/12)   6 each   0   . BENZONATATE 200 MG PO CAPS   Oral   Take 1 capsule (200 mg total) by mouth at bedtime. Take as needed for cough   12 capsule   0   . DOCUSATE SODIUM 100 MG PO CAPS   Oral   Take 100 mg by mouth daily.         Marland Kitchen HYDROCHLOROTHIAZIDE 12.5 MG  PO TABS   Oral   Take 1 tablet (12.5 mg total) by mouth daily.   14 tablet   1   . IBUPROFEN 800 MG PO TABS   Oral   Take 1 tablet (800 mg total) by mouth every 8 (eight) hours as needed for pain.   50 tablet   1   . NORETHINDRONE 0.35 MG PO TABS   Oral   Take 1 tablet (0.35 mg total) by mouth daily.   1 Package   11   . OXYCODONE-ACETAMINOPHEN 5-325 MG PO TABS   Oral   Take 1 tablet by mouth every 4 (four) hours as needed for pain.   50 tablet   0   . PRENATAL MULTIVITAMIN CH   Oral   Take 1 tablet by mouth daily.         Marland Kitchen PROMETHAZINE HCL 12.5 MG PO TABS   Oral   Take 1 tablet (12.5 mg total) by mouth every 6 (six) hours as needed for nausea.   30 tablet   0     BP 134/83  Pulse 92  Temp 98.5 F (36.9 C) (Oral)  Resp 16  Ht 5\' 6"  (1.676 m)  Wt 290 lb (131.543 kg)  BMI 46.81 kg/m2  SpO2 97%  Breastfeeding? No  Physical Exam Nursing notes and Vital Signs reviewed. Appearance:  Patient appears stated age, and in  no acute distress.  Patient is obese (BMI 46.8) Eyes:  Pupils are equal, round, and reactive to light and accomodation.  Extraocular movement is intact.  Conjunctivae are not inflamed  Ears:  Canals normal.  Tympanic membranes normal.  Nose:  Mildly congested turbinates.  No sinus tenderness.  Pharynx:  Normal Neck:  Supple.   Tender shotty posterior nodes are palpated bilaterally  Lungs:  Clear to auscultation.  Breath sounds are equal. Chest:  Distinct tenderness to palpation over the mid-sternum.   Heart:  Regular rate and rhythm without murmurs, rubs, or gallops.  Abdomen:  Nontender without masses or hepatosplenomegaly.  Bowel sounds are present.  No CVA or flank tenderness.  Extremities:  No edema.  No calf tenderness Skin:  No rash present.   ED Course  Procedures none      1. Acute upper respiratory infections of unspecified site       MDM  There is no evidence of bacterial infection today.   Treat symptomatically for now  Prescription written for Benzonatate (Tessalon) to take at bedtime for night-time cough.  Take Mucinex D (guaifenesin with decongestant) twice daily for congestion.  Increase fluid intake, rest. May use Afrin nasal spray (or generic oxymetazoline) twice daily for about 5 days.  Also recommend using saline nasal spray several times daily and saline nasal irrigation (AYR is a common brand) Stop all antihistamines for now, and other non-prescription cough/cold preparations. May take Ibuprofen 200mg , 4 tabs every 8 hours with food for chest/sternum discomfort. Begin Azithromycin if not improving about 7 days or if persistent fever develops (Given a prescription to hold, with an expiration date)  Follow-up with family doctor if not improving 10 days.         Lattie Haw, MD 10/17/12 1016

## 2012-10-23 ENCOUNTER — Encounter: Payer: PRIVATE HEALTH INSURANCE | Admitting: Obstetrics and Gynecology

## 2012-11-04 ENCOUNTER — Ambulatory Visit (INDEPENDENT_AMBULATORY_CARE_PROVIDER_SITE_OTHER): Payer: PRIVATE HEALTH INSURANCE | Admitting: Obstetrics and Gynecology

## 2012-11-04 ENCOUNTER — Encounter: Payer: Self-pay | Admitting: Obstetrics and Gynecology

## 2012-11-04 VITALS — BP 120/76 | Wt 294.0 lb

## 2012-11-04 DIAGNOSIS — Z98891 History of uterine scar from previous surgery: Secondary | ICD-10-CM

## 2012-11-04 MED ORDER — NORETHIN ACE-ETH ESTRAD-FE 1-20 MG-MCG PO TABS: 1.0000 | ORAL_TABLET | Freq: Every day | ORAL | Status: AC

## 2012-11-04 MED ORDER — OXYCODONE-ACETAMINOPHEN 5-325 MG PO TABS: 1.0000 | ORAL_TABLET | ORAL | Status: AC | PRN

## 2012-11-04 NOTE — Progress Notes (Signed)
Date of delivery: 09/15/2012 Female Name: Alyssa Spence  Vaginal delivery:no Cesarean section:yes Tubal ligation:no GDM:no Breast Feeding:no Bottle Feeding:yes Post-Partum Blues:no Abnormal pap:yes Normal GU function: yes Normal GI function:yes Returning to work:yes EPDS: 5

## 2012-11-04 NOTE — Progress Notes (Signed)
Alyssa Spence is a 36 y.o. female who presents for a postpartum visit.   Type of delivery:  Primary LTCS due to suspected macrosomia (baby 8+12)  Patient reports doing well, but husband recently hospitalized for double pneumonia.  Patient also slipped on wet grass, while holding baby in carrier, and pulled groin muscle/round ligament over weekend.  Very sore, using Motrin--had one Percocet left, requests limited refill.  Hx remarkable for: Patient Active Problem List  Diagnosis  . Obesity  . Pregnant state, incidental  . LGA (large for gestational age) fetus affecting management of mother  . Status post primary low transverse cesarean section      PPDS = 5--denies SI/HI, denies pp depression  Contraception plan:  Started Micronor at approx 3-4 weeks, but not breastfeeding now.  Previous Loestrin 1/20 user.   I have fully reviewed the prenatal and intrapartum course   Patient has not been sexually active since delivery.   The following portions of the patient's history were reviewed and updated as appropriate: allergies, current medications, past family history, past medical history, past social history, past surgical history and problem list.  Review of Systems Pertinent items are noted in HPI.   Objective:    BP 120/76  Wt 294 lb (133.358 kg)  Breastfeeding? No  General:  alert, cooperative and no distress     Lungs: clear to auscultation bilaterally  Heart:  regular rate and rhythm, S1, S2 normal, no murmur  Abdomen: soft, non-tender; bowel sounds normal; no masses,  no organomegaly.   Incision:  Well-healed LTCS incision Area over right round ligament/groin area tender to deep palpation.   Vulva:  normal  Vagina: normal vagina  Cervix:  normal  Uterus: normal size, contour, position, consistency, mobility, non-tender, well-involuted  Adnexa:  normal adnexa             Assessment:     Normal postpartum exam.  Pap smear:   Not done at today's visit.   Due  02/2013. Recent muscle strain  Plan:  Rx Percocet for use with muscle strain--continue Ibuprophen. Rxs:  Loestrin 1/20 x 1 year--may complete current pack of Micronor, but be aware of risk since not breastfeeding. Annual due in 4/14.  Nigel Bridgeman CNM, [place] 11/05/2012 3:09 PM    Percocet righ leg Note 11/10/12 loestrin 1/20 micronor

## 2012-11-05 DIAGNOSIS — Z98891 History of uterine scar from previous surgery: Secondary | ICD-10-CM | POA: Insufficient documentation

## 2013-11-12 ENCOUNTER — Encounter: Payer: Self-pay | Admitting: Emergency Medicine

## 2013-11-12 ENCOUNTER — Emergency Department (INDEPENDENT_AMBULATORY_CARE_PROVIDER_SITE_OTHER)
Admission: EM | Admit: 2013-11-12 | Discharge: 2013-11-12 | Disposition: A | Discharge status: Home / Self Care | Payer: PRIVATE HEALTH INSURANCE | Source: Home / Self Care | Attending: Emergency Medicine | Admitting: Emergency Medicine

## 2013-11-12 DIAGNOSIS — J0100 Acute maxillary sinusitis, unspecified: Secondary | ICD-10-CM

## 2013-11-12 DIAGNOSIS — J209 Acute bronchitis, unspecified: Secondary | ICD-10-CM

## 2013-11-12 MED ORDER — AMOXICILLIN 875 MG PO TABS: ORAL_TABLET | ORAL | Status: AC

## 2013-11-12 MED ORDER — FLUTICASONE PROPIONATE 50 MCG/ACT NA SUSP: NASAL | Status: AC

## 2013-11-12 NOTE — ED Provider Notes (Addendum)
CSN: [id number]     Arrival date & time 11/12/13  1418 History   First MD Initiated Contact with Patient 11/12/13 1437     Chief Complaint  Patient presents with  . Nasal Congestion  . Cough  . Facial Pain   (Consider location/radiation/quality/duration/timing/severity/associated sxs/prior Treatment) HPI URI HISTORY  Alyssa Spence is a 38 y.o. female who complains of onset of cold symptoms for 3 days.  Have been using over-the-counter treatment which helps a little bit.  No chills/sweats +  Fever  +  Nasal congestion +  Discolored Post-nasal drainage + sinus pain/pressure No sore throat  +  cough No wheezing No chest congestion No hemoptysis No shortness of breath No pleuritic pain  No itchy/red eyes No earache  No nausea No vomiting No abdominal pain No diarrhea  No skin rashes +  Fatigue No myalgias No headache   Past Medical History  Diagnosis Date  . History of chicken pox   . Yeast infection   . Bacterial infection   . Abnormal Pap smear 2008,10/2011    colpo,2008 & colpo, 2012 : NI  . Hx of constipation   . GERD (gastroesophageal reflux disease)     only with pregnancy-no meds  . Macrosomia    Past Surgical History  Procedure Laterality Date  . Wisdom tooth extraction  10/1996  . Fasciotomy  02/2009    left foot  . Cesarean section  09/15/2012    Procedure: CESAREAN SECTION;  Surgeon: Esmeralda ArthurSandra A Rivard, MD;  Location: [facility];  Service: Obstetrics;  Laterality: N/A;  PRIMARY   Family History  Problem Relation Age of Onset  . Hypertension Maternal Grandmother   . Cancer Maternal Grandmother   . Cancer Paternal Grandmother   . Heart disease Paternal Grandfather    History  Substance Use Topics  . Smoking status: Never Smoker   . Smokeless tobacco: Never Used  . Alcohol Use: Yes   OB History   Grav Para Term Preterm Abortions TAB SAB Ect Mult Living   1 1 1       1      Review of Systems  All other systems reviewed and are  negative.    Allergies  Review of patient's allergies indicates no known allergies.  Home Medications   Current Outpatient Rx  Name  Route  Sig  Dispense  Refill  . acetaminophen (TYLENOL) 325 MG tablet   Oral   Take 650 mg by mouth every 6 (six) hours as needed. For headache         . amoxicillin (AMOXIL) 875 MG tablet      Take 1 twice a day X 10 days.   20 tablet   0   . azithromycin (ZITHROMAX Z-PAK) 250 MG tablet      Take 2 tabs today; then begin one tab once daily for 4 more days. (Rx void after 10/25/12)   6 each   0   . benzonatate (TESSALON) 200 MG capsule   Oral   Take 1 capsule (200 mg total) by mouth at bedtime. Take as needed for cough   12 capsule   0   . docusate sodium (COLACE) 100 MG capsule   Oral   Take 100 mg by mouth daily.         . fluticasone (FLONASE) 50 MCG/ACT nasal spray      1 or 2 sprays each nostril twice a day   16 g   0   . hydrochlorothiazide (HYDRODIURIL) 12.5 MG  tablet   Oral   Take 1 tablet (12.5 mg total) by mouth daily.   14 tablet   1   . ibuprofen (ADVIL,MOTRIN) 800 MG tablet   Oral   Take 1 tablet (800 mg total) by mouth every 8 (eight) hours as needed for pain.   50 tablet   1   . norethindrone (ORTHO MICRONOR) 0.35 MG tablet   Oral   Take 1 tablet (0.35 mg total) by mouth daily.   1 Package   11   . norethindrone-ethinyl estradiol (JUNEL FE,GILDESS FE,LOESTRIN FE) 1-20 MG-MCG tablet   Oral   Take 1 tablet by mouth daily.   1 Package   11   . oxyCODONE-acetaminophen (ROXICET) 5-325 MG per tablet   Oral   Take 1 tablet by mouth every 4 (four) hours as needed for pain.   36 tablet   0   . Prenatal Vit-Fe Fumarate-FA (PRENATAL MULTIVITAMIN) TABS   Oral   Take 1 tablet by mouth daily.         . promethazine (PHENERGAN) 12.5 MG tablet   Oral   Take 1 tablet (12.5 mg total) by mouth every 6 (six) hours as needed for nausea.   30 tablet   0    BP 147/90  Pulse 92  Temp(Src) 98.4 F (36.9  C) (Oral)  Resp 18  Ht 5\' 6"  (1.676 m)  Wt 291 lb (131.997 kg)  BMI 46.99 kg/m2  SpO2 97%  LMP 11/09/2013 Physical Exam  Nursing note and vitals reviewed. Constitutional: She is oriented to person, place, and time. She appears well-developed and well-nourished. No distress.  HENT:  Head: Normocephalic and atraumatic.  Right Ear: Tympanic membrane, external ear and ear canal normal.  Left Ear: Tympanic membrane, external ear and ear canal normal.  Nose: Mucosal edema and rhinorrhea present. Right sinus exhibits maxillary sinus tenderness. Left sinus exhibits maxillary sinus tenderness.  Mouth/Throat: Oropharynx is clear and moist. No oral lesions. No oropharyngeal exudate.  Eyes: Right eye exhibits no discharge. Left eye exhibits no discharge. No scleral icterus.  Neck: Neck supple.  Cardiovascular: Normal rate, regular rhythm and normal heart sounds.   Pulmonary/Chest: Effort normal. She has no wheezes. She has rhonchi. She has no rales.  Lymphadenopathy:    She has no cervical adenopathy.  Neurological: She is alert and oriented to person, place, and time.  Skin: Skin is warm and dry.  Psychiatric: She has a normal mood and affect.    ED Course  Procedures (including critical care time) Labs Review Labs Reviewed - No data to display Imaging Review No results found.  EKG Interpretation    Date/Time:    Ventricular Rate:    PR Interval:    QRS Duration:   QT Interval:    QTC Calculation:   R Axis:     Text Interpretation:              MDM   1. Acute maxillary sinusitis   2. Acute bronchitis     Treatment options discussed, as well as risks, benefits, alternatives. Patient voiced understanding and agreement with the following plans: Amoxicillin Tessalon Pearles prn cough. Mucinex-D Flonase Also, written prescription for Phenergan with codeine 4 ounces, no refills. One or 2 teaspoons at bedtime as needed for severe cough.  Precautions discussed. Red  flags discussed. Questions invited and answered. Patient voiced understanding and agreement.    Lajean Manes, MD 11/12/13 1541  Lajean Manes, MD 11/12/13 [phone]

## 2013-11-12 NOTE — ED Notes (Signed)
Pt c/o non productive cough, chest hurts, green nasal congestion, sinus pressure, and dental pain x 1 day. Denies fever.

## 2014-09-06 ENCOUNTER — Encounter: Payer: Self-pay | Admitting: Emergency Medicine

## 2018-02-24 ENCOUNTER — Emergency Department (HOSPITAL_COMMUNITY)
Admission: EM | Admit: 2018-02-24 | Discharge: 2018-02-25 | Disposition: A | Discharge status: Home / Self Care | Payer: PRIVATE HEALTH INSURANCE | Attending: Emergency Medicine | Admitting: Emergency Medicine

## 2018-02-24 ENCOUNTER — Encounter (HOSPITAL_COMMUNITY): Payer: Self-pay | Admitting: Emergency Medicine

## 2018-02-24 DIAGNOSIS — R197 Diarrhea, unspecified: Secondary | ICD-10-CM | POA: Insufficient documentation

## 2018-02-24 DIAGNOSIS — R110 Nausea: Secondary | ICD-10-CM | POA: Insufficient documentation

## 2018-02-24 DIAGNOSIS — R109 Unspecified abdominal pain: Secondary | ICD-10-CM | POA: Insufficient documentation

## 2018-02-24 DIAGNOSIS — Z5321 Procedure and treatment not carried out due to patient leaving prior to being seen by health care provider: Secondary | ICD-10-CM | POA: Insufficient documentation

## 2018-02-24 LAB — URINALYSIS, ROUTINE W REFLEX MICROSCOPIC
BILIRUBIN URINE: NEGATIVE
Glucose, UA: NEGATIVE mg/dL
KETONES UR: NEGATIVE mg/dL
NITRITE: NEGATIVE
PH: 5 (ref 5.0–8.0)
Protein, ur: NEGATIVE mg/dL
Specific Gravity, Urine: 1.003 — ABNORMAL LOW (ref 1.005–1.030)

## 2018-02-24 LAB — COMPREHENSIVE METABOLIC PANEL
ALT: 19 U/L (ref 14–54)
ANION GAP: 11 (ref 5–15)
AST: 17 U/L (ref 15–41)
Albumin: 3.5 g/dL (ref 3.5–5.0)
Alkaline Phosphatase: 85 U/L (ref 38–126)
BILIRUBIN TOTAL: 0.6 mg/dL (ref 0.3–1.2)
BUN: 12 mg/dL (ref 6–20)
CALCIUM: 8.3 mg/dL — AB (ref 8.9–10.3)
CO2: 20 mmol/L — ABNORMAL LOW (ref 22–32)
Chloride: 108 mmol/L (ref 101–111)
Creatinine, Ser: 0.85 mg/dL (ref 0.44–1.00)
Glucose, Bld: 135 mg/dL — ABNORMAL HIGH (ref 65–99)
Potassium: 3.7 mmol/L (ref 3.5–5.1)
Sodium: 139 mmol/L (ref 135–145)
TOTAL PROTEIN: 6.9 g/dL (ref 6.5–8.1)

## 2018-02-24 LAB — I-STAT BETA HCG BLOOD, ED (MC, WL, AP ONLY): I-stat hCG, quantitative: <5 m[IU]/mL (ref <5)

## 2018-02-24 LAB — CBC
HCT: 41.2 % (ref 36.0–46.0)
HEMOGLOBIN: 14.3 g/dL (ref 12.0–15.0)
MCH: 30.5 pg (ref 26.0–34.0)
MCHC: 34.7 g/dL (ref 30.0–36.0)
MCV: 87.8 fL (ref 78.0–100.0)
Platelets: 281 10*3/uL (ref 150–400)
RBC: 4.69 MIL/uL (ref 3.87–5.11)
RDW: 13 % (ref 11.5–15.5)
WBC: 7.8 10*3/uL (ref 4.0–10.5)

## 2018-02-24 LAB — LIPASE, BLOOD: Lipase: 40 U/L (ref 11–51)

## 2018-02-24 NOTE — ED Triage Notes (Signed)
Patient c/o abdominal cramps with nausea and diarrhea today. Reports taking ibuprofen PTA.

## 2024-07-20 ENCOUNTER — Other Ambulatory Visit (HOSPITAL_BASED_OUTPATIENT_CLINIC_OR_DEPARTMENT_OTHER): Payer: Self-pay

## 2024-07-20 ENCOUNTER — Other Ambulatory Visit (HOSPITAL_COMMUNITY): Payer: Self-pay

## 2024-07-20 MED ORDER — MOUNJARO 2.5 MG/0.5ML ~~LOC~~ SOAJ
2.5000 mg | SUBCUTANEOUS | 3 refills | Status: AC
  Filled 2024-07-20: qty 2, 28d supply, fill #0
  Filled 2024-08-12 – 2024-08-13 (×2): qty 2, 28d supply, fill #1

## 2024-07-21 ENCOUNTER — Other Ambulatory Visit (HOSPITAL_BASED_OUTPATIENT_CLINIC_OR_DEPARTMENT_OTHER): Payer: Self-pay

## 2024-08-12 ENCOUNTER — Other Ambulatory Visit (HOSPITAL_BASED_OUTPATIENT_CLINIC_OR_DEPARTMENT_OTHER): Payer: Self-pay

## 2024-08-13 ENCOUNTER — Other Ambulatory Visit (HOSPITAL_BASED_OUTPATIENT_CLINIC_OR_DEPARTMENT_OTHER): Payer: Self-pay

## 2024-08-13 ENCOUNTER — Other Ambulatory Visit (HOSPITAL_COMMUNITY): Payer: Self-pay

## 2024-08-13 MED ORDER — MOUNJARO 5 MG/0.5ML ~~LOC~~ SOAJ
5.0000 mg | SUBCUTANEOUS | 0 refills | Status: AC
  Filled 2024-08-13: qty 2, 28d supply, fill #0

## 2024-08-14 ENCOUNTER — Other Ambulatory Visit (HOSPITAL_BASED_OUTPATIENT_CLINIC_OR_DEPARTMENT_OTHER): Payer: Self-pay

## 2024-09-04 ENCOUNTER — Other Ambulatory Visit (HOSPITAL_BASED_OUTPATIENT_CLINIC_OR_DEPARTMENT_OTHER): Payer: Self-pay

## 2024-09-04 MED ORDER — MOUNJARO 7.5 MG/0.5ML ~~LOC~~ SOAJ
7.5000 mg | SUBCUTANEOUS | 0 refills | Status: AC
  Filled 2024-09-04: qty 2, 28d supply, fill #0

## 2024-09-28 ENCOUNTER — Other Ambulatory Visit (HOSPITAL_BASED_OUTPATIENT_CLINIC_OR_DEPARTMENT_OTHER): Payer: Self-pay

## 2024-09-28 ENCOUNTER — Other Ambulatory Visit: Payer: Self-pay

## 2024-09-28 MED ORDER — MOUNJARO 10 MG/0.5ML ~~LOC~~ SOAJ
10.0000 mg | SUBCUTANEOUS | 0 refills | Status: DC
  Filled 2024-09-28: qty 2, 28d supply, fill #0

## 2024-09-29 ENCOUNTER — Other Ambulatory Visit (HOSPITAL_BASED_OUTPATIENT_CLINIC_OR_DEPARTMENT_OTHER): Payer: Self-pay

## 2024-11-02 ENCOUNTER — Other Ambulatory Visit (HOSPITAL_BASED_OUTPATIENT_CLINIC_OR_DEPARTMENT_OTHER): Payer: Self-pay

## 2024-11-02 MED ORDER — MOUNJARO 12.5 MG/0.5ML ~~LOC~~ SOAJ
12.5000 mg | SUBCUTANEOUS | 0 refills | Status: AC
  Filled 2024-11-02: qty 2, 28d supply, fill #0

## 2024-11-04 ENCOUNTER — Other Ambulatory Visit (HOSPITAL_BASED_OUTPATIENT_CLINIC_OR_DEPARTMENT_OTHER): Payer: Self-pay

## 2024-11-04 ENCOUNTER — Other Ambulatory Visit: Payer: Self-pay

## 2024-12-07 ENCOUNTER — Other Ambulatory Visit (HOSPITAL_BASED_OUTPATIENT_CLINIC_OR_DEPARTMENT_OTHER): Payer: Self-pay

## 2024-12-07 MED ORDER — MOUNJARO 10 MG/0.5ML ~~LOC~~ SOAJ
10.0000 mg | SUBCUTANEOUS | 0 refills | Status: AC
  Filled 2024-12-07 (×2): qty 2, 28d supply, fill #0
